# Patient Record
Sex: Female | Born: 1980 | Race: Black or African American | Hispanic: No | Marital: Single | Smoking: Never smoker
Health system: Southern US, Community
[De-identification: ages and names within clinical notes are randomized; demographics above are authoritative.]

## PROBLEM LIST (undated history)

## (undated) DIAGNOSIS — L309 Dermatitis, unspecified: Secondary | ICD-10-CM

## (undated) DIAGNOSIS — D649 Anemia, unspecified: Secondary | ICD-10-CM

## (undated) DIAGNOSIS — R519 Headache, unspecified: Secondary | ICD-10-CM

## (undated) DIAGNOSIS — R51 Headache: Secondary | ICD-10-CM

## (undated) HISTORY — PX: DILATION AND CURETTAGE OF UTERUS: SHX78

---
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
## 2012-11-08 ENCOUNTER — Emergency Department (HOSPITAL_COMMUNITY)
Admission: EM | Admit: 2012-11-08 | Discharge: 2012-11-08 | Disposition: A | Discharge status: Home / Self Care | Payer: Medicaid Other | Attending: Emergency Medicine | Admitting: Emergency Medicine

## 2012-11-08 ENCOUNTER — Encounter (HOSPITAL_COMMUNITY): Payer: Self-pay | Admitting: *Deleted

## 2012-11-08 DIAGNOSIS — T7840XA Allergy, unspecified, initial encounter: Secondary | ICD-10-CM

## 2012-11-08 DIAGNOSIS — R21 Rash and other nonspecific skin eruption: Secondary | ICD-10-CM | POA: Insufficient documentation

## 2012-11-08 MED ORDER — PREDNISONE 50 MG PO TABS
60.0000 mg | ORAL_TABLET | Freq: Once | ORAL | Status: AC
  Administered 2012-11-08: 60 mg via ORAL
  Filled 2012-11-08: qty 1

## 2012-11-08 MED ORDER — PREDNISONE 10 MG PO TABS: 20.0000 mg | ORAL_TABLET | Freq: Every day | ORAL | Status: DC

## 2012-11-08 MED ORDER — FAMOTIDINE 20 MG PO TABS
20.0000 mg | ORAL_TABLET | Freq: Once | ORAL | Status: AC
  Administered 2012-11-08: 20 mg via ORAL
  Filled 2012-11-08: qty 1

## 2012-11-08 MED ORDER — DIPHENHYDRAMINE HCL 25 MG PO CAPS
25.0000 mg | ORAL_CAPSULE | Freq: Once | ORAL | Status: AC
  Administered 2012-11-08: 25 mg via ORAL
  Filled 2012-11-08: qty 1

## 2012-11-08 NOTE — ED Notes (Signed)
Pt alert & oriented x4, stable gait. Patient given discharge instructions, paperwork & prescription(s). Patient  instructed to stop at the registration desk to finish any additional paperwork. Patient verbalized understanding. Pt left department w/ no further questions. 

## 2012-11-08 NOTE — ED Notes (Addendum)
Pt states she noticed a rash around 2000 tonight. Pt denies any nwe, meds, clothes or detergents. Pt states she did have a hotdog from [employer] w/ a new bread today.

## 2012-11-08 NOTE — ED Provider Notes (Signed)
History    CSN: [id number] Arrival date & time 11/08/12  0007  First MD Initiated Contact with Patient 11/08/12 0031     Chief Complaint  Patient presents with  . Allergic Reaction  . Rash   (Consider location/radiation/quality/duration/timing/severity/associated sxs/prior Treatment) HPI HPI Comments: Katie Mills is a 31 y.o. female who presents to the Emergency Department complaining of rash and itching that began this evening around 2000. She took zyrtec without relief. Denies fever, chills, nausea, vomiting, recent exposures, new foods or clothing, difficulty with speaking or swallowing.  History reviewed. No pertinent past medical history. Past Surgical History  Procedure Laterality Date  . Dilation and curettage of uterus     No family history on file. History  Substance Use Topics  . Smoking status: Never Smoker   . Smokeless tobacco: Not on file  . Alcohol Use: No   OB History   Grav Para Term Preterm Abortions TAB SAB Ect Mult Living                 Review of Systems  Constitutional: Negative for fever.       10 Systems reviewed and are negative for acute change except as noted in the HPI.  HENT: Negative for congestion.   Eyes: Negative for discharge and redness.  Respiratory: Negative for cough and shortness of breath.   Cardiovascular: Negative for chest pain.  Gastrointestinal: Negative for vomiting and abdominal pain.  Musculoskeletal: Negative for back pain.  Skin: Positive for rash.  Neurological: Negative for syncope, numbness and headaches.  Psychiatric/Behavioral:       No behavior change.    Allergies  Review of patient's allergies indicates no known allergies.  Home Medications   Current Outpatient Rx  Name  Route  Sig  Dispense  Refill  . predniSONE (DELTASONE) 10 MG tablet   Oral   Take 2 tablets (20 mg total) by mouth daily.   10 tablet   0    BP 137/91  Pulse 66  Temp(Src) 98.2 F (36.8 C) (Oral)  Resp 17  Ht 5\' 7"  (1.702  m)  Wt 207 lb (93.895 kg)  BMI 32.41 kg/m2  SpO2 100%  LMP 10/18/2012 Physical Exam  Nursing note and vitals reviewed. Constitutional: She appears well-developed and well-nourished.  Awake, alert, nontoxic appearance.  HENT:  Head: Normocephalic and atraumatic.  Eyes: EOM are normal. Pupils are equal, round, and reactive to light.  Neck: Normal range of motion. Neck supple.  Cardiovascular: Normal rate and intact distal pulses.   Pulmonary/Chest: Effort normal and breath sounds normal. She exhibits no tenderness.  Abdominal: Soft. Bowel sounds are normal. There is no tenderness. There is no rebound.  Musculoskeletal: She exhibits no tenderness.  Baseline ROM, no obvious new focal weakness.  Neurological:  Mental status and motor strength appears baseline for patient and situation.  Skin: No rash noted.  Fine rash and whelps to inner thighs, around her waist, on her chest.   Psychiatric: She has a normal mood and affect.    ED Course  Procedures (including critical care time)  Medications  diphenhydrAMINE (BENADRYL) capsule 25 mg (25 mg Oral Given 11/08/12 0100)  predniSONE (DELTASONE) tablet 60 mg (60 mg Oral Given 11/08/12 0100)  famotidine (PEPCID) tablet 20 mg (20 mg Oral Given 11/08/12 0100)    1. Allergic reaction, initial encounter     MDM  Patient presents with non specific allergic reaction. Given benadryl, pepcid ac, prednisone. Rash faded, itching resolved. Pt stable in ED  with no significant deterioration in condition.The patient appears reasonably screened and/or stabilized for discharge and I doubt any other medical condition or other [facility] requiring further screening, evaluation, or treatment in the ED at this time prior to discharge.  MDM Reviewed: nursing note and vitals     Nicoletta Dress. Colon Branch, MD 11/08/12 Earle Gell

## 2012-11-14 ENCOUNTER — Emergency Department (HOSPITAL_COMMUNITY): Payer: Medicaid Other

## 2012-11-14 ENCOUNTER — Emergency Department (HOSPITAL_COMMUNITY)
Admission: EM | Admit: 2012-11-14 | Discharge: 2012-11-14 | Disposition: A | Discharge status: Home / Self Care | Payer: Medicaid Other | Attending: Emergency Medicine | Admitting: Emergency Medicine

## 2012-11-14 ENCOUNTER — Encounter (HOSPITAL_COMMUNITY): Payer: Self-pay | Admitting: Emergency Medicine

## 2012-11-14 DIAGNOSIS — Z79899 Other long term (current) drug therapy: Secondary | ICD-10-CM | POA: Insufficient documentation

## 2012-11-14 DIAGNOSIS — Z91040 Latex allergy status: Secondary | ICD-10-CM | POA: Insufficient documentation

## 2012-11-14 DIAGNOSIS — Z3202 Encounter for pregnancy test, result negative: Secondary | ICD-10-CM | POA: Insufficient documentation

## 2012-11-14 DIAGNOSIS — R51 Headache: Secondary | ICD-10-CM | POA: Insufficient documentation

## 2012-11-14 DIAGNOSIS — Z872 Personal history of diseases of the skin and subcutaneous tissue: Secondary | ICD-10-CM | POA: Insufficient documentation

## 2012-11-14 HISTORY — DX: Dermatitis, unspecified: L30.9

## 2012-11-14 LAB — CBC WITH DIFFERENTIAL/PLATELET
Basophils Absolute: 0 10*3/uL (ref 0.0–0.1)
Basophils Relative: 0 % (ref 0–1)
Eosinophils Absolute: 0.1 10*3/uL (ref 0.0–0.7)
Eosinophils Relative: 2 % (ref 0–5)
MCH: 29.9 pg (ref 26.0–34.0)
MCHC: 33.2 g/dL (ref 30.0–36.0)
MCV: 90.1 fL (ref 78.0–100.0)
Neutrophils Relative %: 56 % (ref 43–77)
Platelets: 227 10*3/uL (ref 150–400)
RBC: 3.54 MIL/uL — ABNORMAL LOW (ref 3.87–5.11)
RDW: 12.1 % (ref 11.5–15.5)

## 2012-11-14 LAB — COMPREHENSIVE METABOLIC PANEL
ALT: 11 U/L (ref 0–35)
Albumin: 3.4 g/dL — ABNORMAL LOW (ref 3.5–5.2)
Alkaline Phosphatase: 58 U/L (ref 39–117)
Calcium: 8.9 mg/dL (ref 8.4–10.5)
GFR calc Af Amer: >90 mL/min (ref >90)
Potassium: 3.7 mEq/L (ref 3.5–5.1)
Sodium: 139 mEq/L (ref 135–145)
Total Protein: 7.3 g/dL (ref 6.0–8.3)

## 2012-11-14 LAB — URINALYSIS, ROUTINE W REFLEX MICROSCOPIC
Glucose, UA: NEGATIVE mg/dL
Protein, ur: NEGATIVE mg/dL
pH: 6 (ref 5.0–8.0)

## 2012-11-14 LAB — URINE MICROSCOPIC-ADD ON

## 2012-11-14 LAB — PREGNANCY, URINE: Preg Test, Ur: NEGATIVE

## 2012-11-14 MED ORDER — SODIUM CHLORIDE 0.9 % IV BOLUS (SEPSIS)
1000.0000 mL | Freq: Once | INTRAVENOUS | Status: AC
  Administered 2012-11-14: 1000 mL via INTRAVENOUS

## 2012-11-14 MED ORDER — OXYCODONE-ACETAMINOPHEN 5-325 MG PO TABS
ORAL_TABLET | ORAL | Status: AC
  Administered 2012-11-14: 1 via ORAL
  Filled 2012-11-14: qty 1

## 2012-11-14 MED ORDER — HYDROCODONE-ACETAMINOPHEN 5-325 MG PO TABS: 1.0000 | ORAL_TABLET | Freq: Four times a day (QID) | ORAL | Status: DC | PRN

## 2012-11-14 MED ORDER — OXYCODONE-ACETAMINOPHEN 5-325 MG PO TABS: 1.0000 | ORAL_TABLET | Freq: Once | ORAL | Status: AC

## 2012-11-14 MED ORDER — ONDANSETRON HCL 4 MG/2ML IJ SOLN
4.0000 mg | Freq: Once | INTRAMUSCULAR | Status: AC
  Administered 2012-11-14: 4 mg via INTRAVENOUS
  Filled 2012-11-14: qty 2
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
## 2012-11-14 NOTE — ED Notes (Signed)
intermittent dizziness

## 2012-11-14 NOTE — ED Provider Notes (Signed)
History    CSN: [id number] Arrival date & time 11/14/12  1140  First MD Initiated Contact with Patient 11/14/12 1306     Chief Complaint  Patient presents with  . Headache   (Consider location/radiation/quality/duration/timing/severity/associated sxs/prior Treatment) Patient is a 32 y.o. female presenting with headaches. The history is provided by the patient (pt complains of a headache). No language interpreter was used.  Headache Pain location:  Frontal Quality:  Dull Radiates to:  Does not radiate Severity currently:  5/10 Severity at highest:  6/10 Onset quality:  Sudden Timing:  Constant Progression:  Waxing and waning Chronicity:  New Associated symptoms: no abdominal pain, no back pain, no congestion, no cough, no diarrhea, no fatigue, no seizures and no sinus pressure    Past Medical History  Diagnosis Date  . Eczema    Past Surgical History  Procedure Laterality Date  . Dilation and curettage of uterus     History reviewed. No pertinent family history. History  Substance Use Topics  . Smoking status: Never Smoker   . Smokeless tobacco: Not on file  . Alcohol Use: No   OB History   Grav Para Term Preterm Abortions TAB SAB Ect Mult Living                 Review of Systems  Constitutional: Negative for appetite change and fatigue.  HENT: Negative for congestion, sinus pressure and ear discharge.   Eyes: Negative for discharge.  Respiratory: Negative for cough.   Cardiovascular: Negative for chest pain.  Gastrointestinal: Negative for abdominal pain and diarrhea.  Genitourinary: Negative for frequency and hematuria.  Musculoskeletal: Negative for back pain.  Skin: Negative for rash.  Neurological: Positive for headaches. Negative for seizures.  Psychiatric/Behavioral: Negative for hallucinations.    Allergies  Latex and Tomato  Home Medications   Current Outpatient Rx  Name  Route  Sig  Dispense  Refill  . acetaminophen (TYLENOL) 500 MG tablet   Oral   Take 1,000 mg by mouth every 6 (six) hours as needed for pain.         Marland Kitchen ibuprofen (ADVIL,MOTRIN) 200 MG tablet   Oral   Take 800 mg by mouth every 6 (six) hours as needed for pain.         . predniSONE (DELTASONE) 10 MG tablet   Oral   Take 2 tablets (20 mg total) by mouth daily.   10 tablet   0   . HYDROcodone-acetaminophen (NORCO/VICODIN) 5-325 MG per tablet   Oral   Take 1 tablet by mouth every 6 (six) hours as needed for pain.   10 tablet   0    BP 134/90  Pulse 78  Temp(Src) 98.5 F (36.9 C) (Oral)  Resp 20  SpO2 100%  LMP 10/18/2012 Physical Exam  Constitutional: She is oriented to person, place, and time. She appears well-developed.  HENT:  Head: Normocephalic.  Eyes: Conjunctivae and EOM are normal. No scleral icterus.  Neck: Neck supple. No thyromegaly present.  Cardiovascular: Normal rate and regular rhythm.  Exam reveals no gallop and no friction rub.   No murmur heard. Pulmonary/Chest: No stridor. She has no wheezes. She has no rales. She exhibits no tenderness.  Abdominal: She exhibits no distension. There is no tenderness. There is no rebound.  Musculoskeletal: Normal range of motion. She exhibits no edema.  Lymphadenopathy:    She has no cervical adenopathy.  Neurological: She is oriented to person, place, and time. Coordination normal.  Skin: No  rash noted. No erythema.  Psychiatric: She has a normal mood and affect. Her behavior is normal.    ED Course  Procedures (including critical care time) Labs Reviewed  URINALYSIS, ROUTINE W REFLEX MICROSCOPIC - Abnormal; Notable for the following:    Specific Gravity, Urine >1.030 (*)    Hgb urine dipstick SMALL (*)    All other components within normal limits  URINE MICROSCOPIC-ADD ON - Abnormal; Notable for the following:    Squamous Epithelial / LPF FEW (*)    All other components within normal limits  CBC WITH DIFFERENTIAL - Abnormal; Notable for the following:    RBC 3.54 (*)     Hemoglobin 10.6 (*)    HCT 31.9 (*)    All other components within normal limits  COMPREHENSIVE METABOLIC PANEL - Abnormal; Notable for the following:    Albumin 3.4 (*)    Total Bilirubin 0.2 (*)    All other components within normal limits  PREGNANCY, URINE   Ct Head Wo Contrast  11/14/2012   *RADIOLOGY REPORT*  Clinical Data: Headaches and light sensitivity.  CT HEAD WITHOUT CONTRAST  Technique:  Contiguous axial images were obtained from the base of the skull through the vertex without contrast.  Comparison: None.  Findings: There is no acute intracranial hemorrhage, infarction, or mass lesion.  Brain parenchyma is normal.  Osseous structures are normal.  IMPRESSION: Normal exam.   Original Report Authenticated By: Francene Boyers, M.D.   1. Headache     MDM    Benny Lennert, MD 11/14/12 [phone]
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
## 2012-12-23 ENCOUNTER — Emergency Department (HOSPITAL_COMMUNITY): Payer: Medicaid Other

## 2012-12-23 ENCOUNTER — Encounter (HOSPITAL_COMMUNITY): Payer: Self-pay | Admitting: Emergency Medicine

## 2012-12-23 ENCOUNTER — Emergency Department (HOSPITAL_COMMUNITY)
Admission: EM | Admit: 2012-12-23 | Discharge: 2012-12-23 | Disposition: A | Discharge status: Home / Self Care | Payer: Medicaid Other | Attending: Emergency Medicine | Admitting: Emergency Medicine

## 2012-12-23 DIAGNOSIS — IMO0002 Reserved for concepts with insufficient information to code with codable children: Secondary | ICD-10-CM | POA: Insufficient documentation

## 2012-12-23 DIAGNOSIS — Z872 Personal history of diseases of the skin and subcutaneous tissue: Secondary | ICD-10-CM | POA: Insufficient documentation

## 2012-12-23 DIAGNOSIS — M549 Dorsalgia, unspecified: Secondary | ICD-10-CM

## 2012-12-23 DIAGNOSIS — Y9289 Other specified places as the place of occurrence of the external cause: Secondary | ICD-10-CM | POA: Insufficient documentation

## 2012-12-23 DIAGNOSIS — S161XXA Strain of muscle, fascia and tendon at neck level, initial encounter: Secondary | ICD-10-CM

## 2012-12-23 DIAGNOSIS — S139XXA Sprain of joints and ligaments of unspecified parts of neck, initial encounter: Secondary | ICD-10-CM | POA: Insufficient documentation

## 2012-12-23 DIAGNOSIS — Z3202 Encounter for pregnancy test, result negative: Secondary | ICD-10-CM | POA: Insufficient documentation

## 2012-12-23 DIAGNOSIS — Y9389 Activity, other specified: Secondary | ICD-10-CM | POA: Insufficient documentation

## 2012-12-23 MED ORDER — METHOCARBAMOL 500 MG PO TABS: ORAL_TABLET | ORAL | Status: DC

## 2012-12-23 MED ORDER — NAPROXEN 500 MG PO TABS: 500.0000 mg | ORAL_TABLET | Freq: Two times a day (BID) | ORAL | Status: DC
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
## 2012-12-24 NOTE — ED Provider Notes (Signed)
Medical screening examination/treatment/procedure(s) were performed by non-physician practitioner and as supervising physician I was immediately available for consultation/collaboration.    Miya Luviano D Ramon Zanders, MD 12/24/12 0933 

## 2014-01-25 ENCOUNTER — Emergency Department (HOSPITAL_COMMUNITY)
Admission: EM | Admit: 2014-01-25 | Discharge: 2014-01-25 | Disposition: A | Discharge status: Home / Self Care | Payer: Medicaid Other | Attending: Emergency Medicine | Admitting: Emergency Medicine

## 2014-01-25 ENCOUNTER — Encounter (HOSPITAL_COMMUNITY): Payer: Self-pay | Admitting: Emergency Medicine

## 2014-01-25 ENCOUNTER — Emergency Department (HOSPITAL_COMMUNITY): Payer: Medicaid Other

## 2014-01-25 DIAGNOSIS — R51 Headache: Secondary | ICD-10-CM | POA: Insufficient documentation

## 2014-01-25 DIAGNOSIS — Z91040 Latex allergy status: Secondary | ICD-10-CM | POA: Insufficient documentation

## 2014-01-25 DIAGNOSIS — R079 Chest pain, unspecified: Secondary | ICD-10-CM | POA: Insufficient documentation

## 2014-01-25 DIAGNOSIS — M7918 Myalgia, other site: Secondary | ICD-10-CM

## 2014-01-25 DIAGNOSIS — Z79899 Other long term (current) drug therapy: Secondary | ICD-10-CM | POA: Diagnosis not present

## 2014-01-25 DIAGNOSIS — IMO0002 Reserved for concepts with insufficient information to code with codable children: Secondary | ICD-10-CM | POA: Diagnosis not present

## 2014-01-25 DIAGNOSIS — Z872 Personal history of diseases of the skin and subcutaneous tissue: Secondary | ICD-10-CM | POA: Diagnosis not present

## 2014-01-25 DIAGNOSIS — IMO0001 Reserved for inherently not codable concepts without codable children: Secondary | ICD-10-CM | POA: Diagnosis not present

## 2014-01-25 HISTORY — DX: Headache, unspecified: R51.9

## 2014-01-25 HISTORY — DX: Headache: R51

## 2014-01-25 LAB — I-STAT CHEM 8, ED
BUN: 12 mg/dL (ref 6–23)
CREATININE: 0.8 mg/dL (ref 0.50–1.10)
Calcium, Ion: 1.16 mmol/L (ref 1.12–1.23)
Chloride: 103 mEq/L (ref 96–112)
Glucose, Bld: 106 mg/dL — ABNORMAL HIGH (ref 70–99)
HCT: 34 % — ABNORMAL LOW (ref 36.0–46.0)
HEMOGLOBIN: 11.6 g/dL — AB (ref 12.0–15.0)
Potassium: 3.3 mEq/L — ABNORMAL LOW (ref 3.7–5.3)
SODIUM: 139 meq/L (ref 137–147)
TCO2: 24 mmol/L (ref 0–100)

## 2014-01-25 LAB — I-STAT TROPONIN, ED: Troponin i, poc: 0 ng/mL (ref 0.00–0.08)

## 2014-01-25 MED ORDER — GI COCKTAIL ~~LOC~~
30.0000 mL | Freq: Once | ORAL | Status: AC
  Administered 2014-01-25: 30 mL via ORAL
  Filled 2014-01-25: qty 30

## 2014-01-25 MED ORDER — ACETAMINOPHEN 500 MG PO TABS
1000.0000 mg | ORAL_TABLET | Freq: Once | ORAL | Status: AC
  Administered 2014-01-25: 1000 mg via ORAL
  Filled 2014-01-25: qty 2

## 2014-01-25 MED ORDER — IBUPROFEN 400 MG PO TABS
400.0000 mg | ORAL_TABLET | Freq: Once | ORAL | Status: AC
  Administered 2014-01-25: 400 mg via ORAL
  Filled 2014-01-25: qty 1

## 2014-01-25 MED ORDER — METHOCARBAMOL 500 MG PO TABS: 1000.0000 mg | ORAL_TABLET | Freq: Four times a day (QID) | ORAL | Status: DC | PRN

## 2014-01-25 MED ORDER — HYDROCODONE-ACETAMINOPHEN 5-325 MG PO TABS: ORAL_TABLET | ORAL | Status: DC

## 2014-01-25 NOTE — ED Provider Notes (Signed)
CSN: [id number]     Arrival date & time 01/25/14  4098 History   First MD Initiated Contact with Patient 01/25/14 0256     Chief Complaint  Patient presents with  . Chest Pain      HPI Pt was seen at 0300. Per pt, c/o gradual onset and persistence of constant upper back "pain" and chest "pain" that began yesterday afternoon at 1400. Pt states she was at work when the discomfort began. 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 Pain worsens with palpation of the area and body position changes. States the pain "takes her breath." Denies palpitations, no SOB/cough, no abd pain, no N/V/D, no rash, no fevers, no injury.    Past Medical History  Diagnosis Date  . Eczema   . Headache    Past Surgical History  Procedure Laterality Date  . Dilation and curettage of uterus     Family History  Problem Relation Age of Onset  . Cancer Other    History  Substance Use Topics  . Smoking status: Never Smoker   . Smokeless tobacco: Never Used  . Alcohol Use: No   OB History   Grav Para Term Preterm Abortions TAB SAB Ect Mult Living   Review of Systems ROS: Statement: All systems negative except as marked or noted in the HPI; Constitutional: Negative for fever and chills. ; ; Eyes: Negative for eye pain, redness and discharge. ; ; ENMT: Negative for ear pain, hoarseness, nasal congestion, sinus pressure and sore throat. ; ; Cardiovascular: +CP. Negative for palpitations, diaphoresis, and peripheral edema. ; ; Respiratory: Negative for cough, wheezing and stridor. ; ; Gastrointestinal: Negative for nausea, vomiting, diarrhea, abdominal pain, blood in stool, hematemesis, jaundice and rectal bleeding. . ; ; Genitourinary: Negative for dysuria, flank pain and hematuria. ; ; Musculoskeletal: +back pain. Negative for neck pain. Negative for swelling and trauma.; ; Skin: Negative for pruritus, rash, abrasions, blisters, bruising and skin lesion.; ; Neuro: Negative for headache,  lightheadedness and neck stiffness. Negative for weakness, altered level of consciousness , altered mental status, extremity weakness, paresthesias, involuntary movement, seizure and syncope.      Allergies  Latex and Tomato  Home Medications   Prior to Admission medications   Medication Sig Start Date End Date Taking? Authorizing Provider  HYDROcodone-acetaminophen (NORCO/VICODIN) 5-325 MG per tablet Take 1 tablet by mouth every 6 (six) hours as needed for pain. 11/14/12  Yes Benny Lennert, MD  naproxen (NAPROSYN) 500 MG tablet Take 1 tablet (500 mg total) by mouth 2 (two) times daily with a meal. 12/23/12  Yes Tammy L. Triplett, PA-C  acetaminophen (TYLENOL) 500 MG tablet Take 1,000 mg by mouth every 6 (six) hours as needed for pain.    Historical Provider, MD  HYDROcodone-acetaminophen (NORCO/VICODIN) 5-325 MG per tablet 1 or 2 tabs PO q6 hours prn pain 01/25/14   Samuel Jester, DO  ibuprofen (ADVIL,MOTRIN) 200 MG tablet Take 800 mg by mouth every 6 (six) hours as needed for pain.    Historical Provider, MD  methocarbamol (ROBAXIN) 500 MG tablet Two tabs po TID prn muscle spasms 12/23/12   Tammy L. Triplett, PA-C  methocarbamol (ROBAXIN) 500 MG tablet Take 2 tablets (1,000 mg total) by mouth 4 (four) times daily as needed for muscle spasms (muscle spasm/pain). 01/25/14   Samuel Jester, DO  predniSONE (DELTASONE) 10 MG tablet Take 2 tablets (20 mg total) by mouth daily. 11/08/12  Annamarie Dawley, MD   BP 136/88  Pulse 74  Temp(Src) 98.1 F (36.7 C) (Oral)  Resp 18  Ht 5' 7.5" (1.715 m)  Wt 207 lb (93.895 kg)  BMI 31.92 kg/m2  SpO2 100%  LMP 01/08/2014 Physical Exam 0305: Physical examination:  Nursing notes reviewed; Vital signs and O2 SAT reviewed;  Constitutional: Well developed, Well nourished, Well hydrated, In no acute distress; Head:  Normocephalic, atraumatic; Eyes: EOMI, PERRL, No scleral icterus; ENMT: Mouth and pharynx normal, Mucous membranes moist; Neck: Supple, Full  range of motion, No lymphadenopathy; Cardiovascular: Regular rate and rhythm, No murmur, rub, or gallop; Respiratory: Breath sounds clear & equal bilaterally, No rales, rhonchi, wheezes.  Speaking full sentences with ease, Normal respiratory effort/excursion; Chest: +bilat anterior chest wall tender to palp. No rash, no deformity, no soft tissue crepitus. Movement normal; Abdomen: Soft, Nontender, Nondistended, Normal bowel sounds; Genitourinary: No CVA tenderness; Spine:  No midline CS, TS, LS tenderness. +TTP bilat thoracic paraspinal muscles. No rash, no ecchymosis.;; Extremities: Pulses normal, No tenderness, No edema, No calf edema or asymmetry.; Neuro: AA&Ox3, Major CN grossly intact.  Speech clear. No gross focal motor or sensory deficits in extremities.; Skin: Color normal, Warm, Dry.   ED Course  Procedures     EKG Interpretation   Date/Time:  Wednesday January 25 2014 02:50:36 EDT Ventricular Rate:  71 PR Interval:  194 QRS Duration: 91 QT Interval:  381 QTC Calculation: 414 R Axis:   56 Text Interpretation:  Sinus rhythm Consider left atrial enlargement  Baseline wander No old tracing to compare Confirmed by [facility]  MD,  Nicholos Johns [phone]) on 01/25/2014 3:51:26 AM      MDM  MDM Reviewed: previous chart, nursing note and vitals Reviewed previous: labs Interpretation: labs, ECG and x-ray    Results for orders placed during the hospital encounter of 01/25/14  I-STAT CHEM 8, ED      Result Value Ref Range   Sodium 139  137 - 147 mEq/L   Potassium 3.3 (*) 3.7 - 5.3 mEq/L   Chloride 103  96 - 112 mEq/L   BUN 12  6 - 23 mg/dL   Creatinine, Ser 5.78  0.50 - 1.10 mg/dL   Glucose, Bld 469 (*) 70 - 99 mg/dL   Calcium, Ion 6.29  5.28 - 1.23 mmol/L   TCO2 24  0 - 100 mmol/L   Hemoglobin 11.6 (*) 12.0 - 15.0 g/dL   HCT 41.3 (*) 24.4 - 01.0 %  I-STAT TROPOININ, ED      Result Value Ref Range   Troponin i, poc 0.00  0.00 - 0.08 ng/mL   Comment 3            Dg Chest 2  View 01/25/2014   CLINICAL DATA:  Mid back pain.  Chest pain and shortness of breath.  EXAM: CHEST  2 VIEW  COMPARISON:  None.  FINDINGS: The lungs are well-aerated and clear. There is no evidence of focal opacification, pleural effusion or pneumothorax.  The heart is normal in size; the mediastinal contour is within normal limits. No acute osseous abnormalities are seen.  IMPRESSION: No acute cardiopulmonary process seen.   Electronically Signed   By: Roanna Raider M.D.   On: 01/25/2014 04:19    0430:  Pt feels better after meds and wants to go home now. Doubt PE as cause for symptoms with low risk [place].  Doubt ACS as cause for symptoms with normal troponin and unchanged EKG from previous after 12+  hours of constant symptoms. CXR with normal mediastinum. Will continue to tx symptomatically at this time. Dx and testing d/w pt.  Questions answered.  Verb understanding, agreeable to d/c home with outpt f/u.     Samuel Jester, DO 01/28/14 [phone]

## 2014-01-25 NOTE — ED Notes (Signed)
Pt c/o mid back pain that started 6 hours ago and now is having chest pain with sob.

## 2014-01-25 NOTE — Discharge Instructions (Signed)
°Emergency Department Resource Guide °1) Find a Doctor and Pay Out of Pocket °Although you won't have to find out who is covered by your insurance plan, it is a good idea to ask around and get recommendations. You will then need to call the office and see if the doctor you have chosen will accept you as a new patient and what types of options they offer for patients who are self-pay. Some doctors offer discounts or will set up payment plans for their patients who do not have insurance, but you will need to ask so you aren't surprised when you get to your appointment. ° °2) Contact Your Local Health Department °Not all health departments have doctors that can see patients for sick visits, but many do, so it is worth a call to see if yours does. If you don't know where your local health department is, you can check in your phone book. The CDC also has a tool to help you locate your state's health department, and many state websites also have listings of all of their local health departments. ° °3) Find a Walk-in Clinic °If your illness is not likely to be very severe or complicated, you may want to try a walk in clinic. These are popping up all over the country in pharmacies, drugstores, and shopping centers. They're usually staffed by nurse practitioners or physician assistants that have been trained to treat common illnesses and complaints. They're usually fairly quick and inexpensive. However, if you have serious medical issues or chronic medical problems, these are probably not your best option. ° °No Primary Care Doctor: °- Call [facility] at  832-8000 - they can help you locate a primary care doctor that  accepts your insurance, provides certain services, etc. °- Physician Referral Service- 1-800-533-3463 ° °Chronic Pain Problems: °Organization         Address  Phone   Notes  °[facility]  (336) 297-2271 Patients need to be referred by their primary care doctor.  ° °Medication  Assistance: °Organization         Address  Phone   Notes  °[place] [facility] [address] °[place], [place] [address] (336) 641-8030 --Must be a resident of [place] °-- Must have NO insurance coverage whatsoever (no Medicaid/ Medicare, etc.) °-- The pt. MUST have a primary care doctor that directs their care regularly and follows them in the community °  °[facility]  (866) 331-1348   °[employer]  (888) 892-1162   ° °Agencies that provide inexpensive medical care: °Organization         Address  Phone   Notes  °[facility]  (336) 832-8035   °[facility]    (336) 832-7272   °[facility] [address] °[place], [place] [address] (336) 832-4777   °[facility] [address], °[place] (336) 271-4999   °[facility]    (336) 373-0678   °[facility]    (336) 272-1050   °[facility] ° [address], [place] Phone:  (336) 832-4444, Fax:  (336) 832-4440 Hours of Operation:  9 am - 6 pm, M-F.  Also accepts Medicaid/Medicare and self-pay.  °[facility] ° [address], [place] Phone: (336) 832-3150, Fax: (336) [phone]. Hours of Operation:  8:30 am - 5:30 pm, M-F.  Also accepts Medicaid and self-pay.  °[facility] [place] [address], [place] Phone: (336) 878-6027   °[facility] [address], [place], [place] (336)723-1848, Ext. 123 Mondays & Thursdays: 7-9 AM.  First 15 patients are seen on a first come, first serve basis. °  ° °Medicaid-accepting [place] Providers: ° °Organization         Address  Phone   Notes  °[facility] [address], [place] (336) 641-2100 Also accepts self-pay patients.  °[facility] [address], [place] ° (336) 856-9996   °[facility] [address], [place]  (336) 288-8857   °[facility] [address] [place] [address], [place] (336) 299-7000   °Veita Bland [address], [place]  ° (336) 373-1557 Only accepts [place] Access Medicaid patients after they have their name applied to their card.  ° °Self-Pay (no insurance) in [place]: ° °Organization         Address  Phone   Notes  °Sickle Cell Patients, [facility] [address], [place] (336) 832-1970   °[facility] [address], [place] (336) 832-4400   °[facility] ° [address],  (336) 992-4800   °[facility]/Dr. Osei-Bonsu ° [address] [place] [address], [place] or [address], [place] (336) [phone] Phone number for both [place] and [place] locations is the same.  °[facility] [address], [place] (336) 299-0000   °[facility] [address] [place] [address], [place] or [address] (336) 852-7530 °(336) 878-2260   °[facility] [address], [place] (336) 350-1642, phone; (336) 294-5005, fax Sees patients 1st and 3rd Saturday of every month.  Must not qualify for public or private insurance (i.e. Medicaid, Medicare, [place] Health Choice, Veterans' Benefits) • Household income should be no more than 200% of the poverty level •The clinic cannot treat you if you are pregnant or think you are pregnant • Sexually transmitted diseases are not treated at the clinic.  ° ° °Dental Care: °Organization         Address  Phone  Notes  °[place] [facility] [address], [place] (336) 641-6152 Accepts children up to age 21 who are enrolled in Medicaid or [place] Health Choice; pregnant women with a Medicaid card; and children who have applied for Medicaid or [place] Health Choice, but were declined, whose parents can pay a reduced fee at time of service.  °[place]  [facility] [place]  [address], [place] (336) 641-7733 Accepts children up to age 21 who are enrolled in Medicaid or [place] Health Choice; pregnant women with a Medicaid card; and children who have applied for Medicaid or [place] Health Choice, but were declined, whose parents can pay a reduced fee at time of service.  °[facility] ° [address], [place] (336) 641-4533 Patients are seen by appointment only. Walk-ins are not accepted. [facility] will see patients 18 years of age and older. °Monday - Tuesday (8am-5pm) °Most Wednesdays (8:30-5pm) °$30 per visit, cash only  °[facility] ° [address], [place] (336) 641-4533 Patients are seen by appointment only. Walk-ins are not accepted. [facility] will see patients 18 years of age and older. °One   Wednesday Evening (Monthly: Volunteer Based).  $30 per visit, cash only  °[facility]  (919) 537-3737 for adults; Children under age 4, call Graduate Pediatric Dentistry at ([phone]) [phone]. Children aged 4-14, please call (919) [phone] to request a pediatric application. ° Dental services are provided in all areas of dental care including fillings, crowns and bridges, complete and partial dentures, implants, gum treatment, root canals, and extractions. Preventive care is also provided. Treatment is provided to both adults and children. °Patients are selected via a lottery and there is often a waiting list. °  °[facility] [address], °[place] ° (336) 763-8833 [email or web address] °  °[facility] [address], [place], [place] (336)723-1848, Ext. 123 Second and Fourth Thursday of each month, opens at 6:30 AM; Clinic ends at 9 AM.  Patients are seen on a first-come first-served basis, and a limited number are seen during each clinic.  ° °[facility] ° [address], [place], [place] (336) 723-7904    Eligibility Requirements °You must have lived in [place], [place], or [place] counties for at least the last three months. °  You cannot be eligible for state or federal sponsored healthcare insurance, including Veterans Administration, Medicaid, or Medicare. °  You generally cannot be eligible for healthcare insurance through your employer.  °  How to apply: °Eligibility screenings are held every Tuesday and Wednesday afternoon from 1:00 pm until 4:00 pm. You do not need an appointment for the interview!  °[facility] [address], [place], [place] 336-631-2330   °[facility]  336-342-8273   °[facility]  336-703-3100   °[facility]  336-570-6415   ° °Behavioral Health Resources in the Community: °Intensive Outpatient Programs °Organization         Address  Phone  Notes  °[facility] [address], [place], [place] 336-878-6098   °[facility] [address], [place], [place] 336-832-9800   °[facility] [address], [place], [place] ° 336-882-2125   °[facility] [address],  °[place], [place] 1-800-853-5163 or 336-641-4981   °Substance Abuse Resources °Organization         Address  Phone  Notes  °[facility]  336-882-2125   °[facility]  336-784-9470   °[facility]  336-285-9073   °[facility]  336-845-3988   °Residential & Outpatient Substance Abuse Program  1-800-659-3381   °Psychological Services °Organization         Address  Phone  Notes  °[facility]  336- 832-9600   °[facility]  336- 378-7881   °[facility] [address], [place] 1-800-853-5163 or 336-641-4981   ° °Mobile Crisis Teams °Organization         Address  Phone  Notes  °[facility], Mobile Crisis Care Unit  1-877-626-1772   °Assertive °[facility] ° [address]  [place], [place] 336-834-9664   °Sharon DeEsch [address] °[place] 336-554-5454   ° °Self-Help/Support Groups °Organization         Address  Phone             Notes  °[facility] - variety of support groups  336- 373-1402 Call for more information  °Narcotics Anonymous (NA), [facility] [address], °[place]  2 meetings at this location  ° °  Residential Treatment Programs °Organization         Address  Phone  Notes  °[facility] [address],    °[place]  1-866-801-8205   °[facility] ° [address], [place], [place] 704-293-8524   °[facility] [address], [place] 336-845-3988 Admissions: 8am-3pm M-F  °[facility] [address]    °[place], [place] 336-841-1104   °[facility] [address], [place], [place] 336-379-7146   °[facility] [address]  °[place], [place] 336-285-9073   °[facility] Intensive Outpatient [address], [place], [place] 336-852-3033   °[facility] [address]  °[place], [place] 1-877-615-2722 or 336-784-9470   °[facility] [address] [place], [place] 336-227-7417 Accepts Medicaid  °[facility] [address]  °[place] 1-800-659-3381 Substance Abuse/Addiction Treatment  ° °[place] Behavioral Health Resources °Organization         Address  Phone  Notes  °[facility]  (888) 581-9988   °Julie Brannon, PhD [address] [place], [place]   (336) 349-5553 or (336) 951-0000   °[facility]   [address] °[place], [place] (336) 349-4454   °[facility] [address], [place], [place] (336) 342-8316 Insurance/Medicaid/sponsorship through [facility]  °[facility] [address]                                    [place], [place] (336) 342-8316 Therapy/tele-psych/case    °[facility] [address]  ° [place], [place] (336) 349-2233    °Dr. Arfeen  (336) 349-4544   °[facility] [place]  [employer] [place] [facility] 1) [address], [place] °2) [address], [place] °3)  [address], [place] (336) 349-3220 °(336) 342-7768 ° °(336) 342-8140   °[place] [facility] (336) 342-1394 or (336) 342-3537 (After Hours)    ° ° °Take the prescriptions as directed.  Apply moist heat or ice to the area(s) of discomfort, for 15 minutes at a time, several times per day for the next few days.  Do not fall asleep on a heating or ice pack.  Call your regular medical doctor today to schedule a follow up appointment in the next 2 days.  Return to the Emergency Department immediately if worsening. ° °

## 2014-03-13 ENCOUNTER — Encounter (HOSPITAL_COMMUNITY): Payer: Self-pay | Admitting: Emergency Medicine

## 2015-10-16 ENCOUNTER — Emergency Department (HOSPITAL_COMMUNITY): Payer: Medicaid Other

## 2015-10-16 ENCOUNTER — Encounter (HOSPITAL_COMMUNITY): Payer: Self-pay | Admitting: Emergency Medicine

## 2015-10-16 DIAGNOSIS — M79671 Pain in right foot: Secondary | ICD-10-CM | POA: Insufficient documentation

## 2015-10-16 DIAGNOSIS — Z5321 Procedure and treatment not carried out due to patient leaving prior to being seen by health care provider: Secondary | ICD-10-CM | POA: Diagnosis not present

## 2015-10-16 NOTE — ED Notes (Signed)
Pt hit R foot on slide. Foot is swollen and painful.

## 2015-10-17 ENCOUNTER — Emergency Department (HOSPITAL_COMMUNITY)
Admission: EM | Admit: 2015-10-17 | Discharge: 2015-10-17 | Disposition: A | Discharge status: Home / Self Care | Payer: Medicaid Other | Attending: Emergency Medicine | Admitting: Emergency Medicine

## 2015-10-17 NOTE — ED Notes (Signed)
Per registration pt left facility. 

## 2017-12-02 ENCOUNTER — Encounter: Payer: Self-pay | Admitting: Emergency Medicine

## 2017-12-02 ENCOUNTER — Emergency Department
Admission: EM | Admit: 2017-12-02 | Discharge: 2017-12-02 | Disposition: A | Discharge status: Home / Self Care | Payer: Medicaid Other | Attending: Emergency Medicine | Admitting: Emergency Medicine

## 2017-12-02 ENCOUNTER — Emergency Department: Payer: Medicaid Other

## 2017-12-02 DIAGNOSIS — Z79899 Other long term (current) drug therapy: Secondary | ICD-10-CM | POA: Diagnosis not present

## 2017-12-02 DIAGNOSIS — Z91040 Latex allergy status: Secondary | ICD-10-CM | POA: Diagnosis not present

## 2017-12-02 DIAGNOSIS — J039 Acute tonsillitis, unspecified: Secondary | ICD-10-CM | POA: Insufficient documentation

## 2017-12-02 DIAGNOSIS — J029 Acute pharyngitis, unspecified: Secondary | ICD-10-CM | POA: Diagnosis present

## 2017-12-02 HISTORY — DX: Anemia, unspecified: D64.9

## 2017-12-02 LAB — BASIC METABOLIC PANEL
ANION GAP: 6 (ref 5–15)
BUN: 13 mg/dL (ref 6–20)
CALCIUM: 8.5 mg/dL — AB (ref 8.9–10.3)
CO2: 26 mmol/L (ref 22–32)
CREATININE: 0.85 mg/dL (ref 0.44–1.00)
Chloride: 106 mmol/L (ref 98–111)
GFR calc non Af Amer: >60 mL/min (ref >60)
GLUCOSE: 102 mg/dL — AB (ref 70–99)
Potassium: 3.5 mmol/L (ref 3.5–5.1)
Sodium: 138 mmol/L (ref 135–145)

## 2017-12-02 LAB — CBC
HCT: 33.9 % — ABNORMAL LOW (ref 35.0–47.0)
Hemoglobin: 11.6 g/dL — ABNORMAL LOW (ref 12.0–16.0)
MCH: 30.4 pg (ref 26.0–34.0)
MCHC: 34.3 g/dL (ref 32.0–36.0)
MCV: 88.8 fL (ref 80.0–100.0)
Platelets: 262 10*3/uL (ref 150–440)
RBC: 3.82 MIL/uL (ref 3.80–5.20)
RDW: 12.9 % (ref 11.5–14.5)
WBC: 9.3 10*3/uL (ref 3.6–11.0)

## 2017-12-02 MED ORDER — CEPHALEXIN 500 MG PO CAPS: 500.0000 mg | ORAL_CAPSULE | Freq: Three times a day (TID) | ORAL | 0 refills | Status: DC

## 2017-12-02 MED ORDER — DEXAMETHASONE SODIUM PHOSPHATE 10 MG/ML IJ SOLN
10.0000 mg | Freq: Once | INTRAMUSCULAR | Status: AC
  Administered 2017-12-02: 10 mg via INTRAVENOUS
  Filled 2017-12-02: qty 1

## 2017-12-02 MED ORDER — GI COCKTAIL ~~LOC~~
30.0000 mL | Freq: Once | ORAL | Status: AC
  Administered 2017-12-02: 30 mL via ORAL
  Filled 2017-12-02: qty 30

## 2017-12-02 MED ORDER — IOHEXOL 300 MG/ML  SOLN
75.0000 mL | Freq: Once | INTRAMUSCULAR | Status: AC | PRN
  Administered 2017-12-02: 75 mL via INTRAVENOUS

## 2017-12-02 NOTE — ED Triage Notes (Signed)
Patient presents to ED via POV from home with c/o sore throat. Patient states "It feels like something is draining and like my throat is closing". Large peritonsillar abscess noted to left tonsil. Patient able to swallow. Even and non labored respirations noted.

## 2017-12-02 NOTE — ED Provider Notes (Signed)
[facility] Emergency Department Provider Note  Time seen: 9:52 AM  I have reviewed the triage vital signs and the nursing notes.   HISTORY  Chief Complaint Sore Throat    HPI Katie Mills is a 37 y.o. female with a past medical history of anemia presents to the emergency department for left throat pain.  According to the patient yesterday she developed a sore throat mostly on the left side.  Today she states pain with attempting to swallow and worsening pain in the left side of her throat.  Denies any nausea or vomiting, denies any trouble breathing does state pain with attempted swallowing.  Able to handle her secretions without issue.  No known fever.  No known sick contacts.  States moderate throat pain currently.   Past Medical History:  Diagnosis Date  . Anemia   . Eczema   . Headache     There are no active problems to display for this patient.   Past Surgical History:  Procedure Laterality Date  . DILATION AND CURETTAGE OF UTERUS      Prior to Admission medications   Medication Sig Start Date End Date Taking? Authorizing Provider  acetaminophen (TYLENOL) 500 MG tablet Take 1,000 mg by mouth every 6 (six) hours as needed for pain.    [provider]  HYDROcodone-acetaminophen (NORCO/VICODIN) 5-325 MG per tablet Take 1 tablet by mouth every 6 (six) hours as needed for pain. 11/14/12   Bethann Berkshire, MD  HYDROcodone-acetaminophen (NORCO/VICODIN) 5-325 MG per tablet 1 or 2 tabs PO q6 hours prn pain 01/25/14   Samuel Jester, DO  ibuprofen (ADVIL,MOTRIN) 200 MG tablet Take 800 mg by mouth every 6 (six) hours as needed for pain.    [provider]  methocarbamol (ROBAXIN) 500 MG tablet Two tabs po TID prn muscle spasms 12/23/12   Triplett, Tammy, PA-C  methocarbamol (ROBAXIN) 500 MG tablet Take 2 tablets (1,000 mg total) by mouth 4 (four) times daily as needed for muscle spasms (muscle spasm/pain). 01/25/14   Samuel Jester, DO   naproxen (NAPROSYN) 500 MG tablet Take 1 tablet (500 mg total) by mouth 2 (two) times daily with a meal. 12/23/12   Triplett, Tammy, PA-C  predniSONE (DELTASONE) 10 MG tablet Take 2 tablets (20 mg total) by mouth daily. 11/08/12   Annamarie Dawley, MD    Allergies  Allergen Reactions  . Latex Hives  . Tomato Hives    Family History  Problem Relation Age of Onset  . Cancer Other     Social History Social History   Tobacco Use  . Smoking status: Never Smoker  . Smokeless tobacco: Never Used  Substance Use Topics  . Alcohol use: No  . Drug use: No    Review of Systems Constitutional: Negative for fever. Eyes: Negative for visual complaints ENT: Positive for left sore throat Cardiovascular: Negative for chest pain. Respiratory: Negative for shortness of breath. Gastrointestinal: Negative for abdominal pain, vomiting  Genitourinary: Negative for urinary compaints Musculoskeletal: Negative for musculoskeletal complaints Skin: Negative for skin complaints  Neurological: Negative for headache All other ROS negative  ____________________________________________   PHYSICAL EXAM:  VITAL SIGNS: ED Triage Vitals [12/02/17 0933]  Enc Vitals Group     BP 127/90     Pulse Rate 91     Resp 18     Temp 98.4 F (36.9 C)     Temp Source Oral     SpO2 99 %     Weight 210 lb (95.3  kg)     Height 5\' 7"  (1.702 m)     Head Circumference      Peak Flow      Pain Score 8     Pain Loc      Pain Edu?      Excl. in GC?    Constitutional: Alert and oriented. Well appearing and in no distress. Eyes: Normal exam ENT   Head: Normocephalic and atraumatic.  Normal tympanic membranes and external auditory canals.   Mouth/Throat: Mucous membranes are moist.  Significant left tonsillar hypertrophy, large right tonsil but not as large as the left tonsil.  Significant erythema on the left tonsil no significant exudate noted.  Midline uvula. Cardiovascular: Normal rate, regular rhythm.  No murmur Respiratory: Normal respiratory effort without tachypnea nor retractions. Breath sounds are clear  Gastrointestinal: Soft and nontender. No distention.   Musculoskeletal: Nontender with normal range of motion in all extremities.  Neurologic:  Normal speech and language. No gross focal neurologic deficits  Skin:  Skin is warm, dry and intact.  Psychiatric: Mood and affect are normal.   ____________________________________________     RADIOLOGY  CT positive for tonsillitis been negative for peritonsillar abscess.  ____________________________________________   INITIAL IMPRESSION / ASSESSMENT AND PLAN / ED COURSE  Pertinent labs & imaging results that were available during my care of the patient were reviewed by me and considered in my medical decision making (see chart for details).  Patient presents to the emergency department for left-sided sore throat.  Differential would include tonsillitis, peritonsillar abscess, pharyngitis.  Examination does show enlarged tonsils left greater than right but the patient appears to have large tonsils at baseline.  The left tonsil is very erythematous.  We will obtain a strep swab, check basic labs and obtain a CT scan of the neck with contrast to evaluate for peritonsillar abscess.  Patient agreeable to this plan of care.  CT negative for abscess.  Labs are largely within normal limits.  Will cover with Keflex.  I discussed return precautions.  Patient agreeable to plan of care. ____________________________________________   FINAL CLINICAL IMPRESSION(S) / ED DIAGNOSES  Tonsillitis    Minna AntisPaduchowski, Anishka Bushard, MD 12/02/17 1137

## 2017-12-02 NOTE — ED Notes (Signed)
Dr. Lenard LancePaduchowski notified of patient presentation.

## 2017-12-13 ENCOUNTER — Encounter: Payer: Self-pay | Admitting: Emergency Medicine

## 2017-12-13 ENCOUNTER — Other Ambulatory Visit: Payer: Self-pay

## 2017-12-13 ENCOUNTER — Emergency Department
Admission: EM | Admit: 2017-12-13 | Discharge: 2017-12-13 | Disposition: A | Discharge status: Home / Self Care | Payer: Medicaid Other | Attending: Emergency Medicine | Admitting: Emergency Medicine

## 2017-12-13 DIAGNOSIS — B373 Candidiasis of vulva and vagina: Secondary | ICD-10-CM | POA: Diagnosis not present

## 2017-12-13 DIAGNOSIS — B3731 Acute candidiasis of vulva and vagina: Secondary | ICD-10-CM

## 2017-12-13 DIAGNOSIS — Z79899 Other long term (current) drug therapy: Secondary | ICD-10-CM | POA: Insufficient documentation

## 2017-12-13 DIAGNOSIS — N898 Other specified noninflammatory disorders of vagina: Secondary | ICD-10-CM | POA: Diagnosis present

## 2017-12-13 LAB — WET PREP, GENITAL
CLUE CELLS WET PREP: NONE SEEN
Sperm: NONE SEEN
TRICH WET PREP: NONE SEEN

## 2017-12-13 MED ORDER — FLUCONAZOLE 150 MG PO TABS: 150.0000 mg | ORAL_TABLET | Freq: Every day | ORAL | 0 refills | Status: DC

## 2017-12-13 MED ORDER — MICONAZOLE NITRATE 2 % VA CREA: 1.0000 | TOPICAL_CREAM | Freq: Every day | VAGINAL | 0 refills | Status: DC

## 2017-12-13 NOTE — ED Notes (Addendum)
Pt states she has been on Cephalexin for the past 3 days, last dose was last night, states she started noticing vaginal irritation and itching. Denies any vaginal discharge.  Pt states she has not been sexually active in 2.5 years.

## 2017-12-13 NOTE — ED Notes (Signed)
First Nurse Note: Patient hospitalized last week, placed on abx - cephalexin 500 mg.  Complaining of vaginal itching at this time.  Denies SHOB.  Speech clear.

## 2017-12-13 NOTE — Discharge Instructions (Addendum)
Follow-up with your primary care provider or [facility] if any continued problems.  [facility]  There are 2 medications.  One is a tablet for yeast the other is a vaginal cream to be used at night for 7 days.

## 2017-12-13 NOTE — ED Provider Notes (Signed)
[facility] Emergency Department Provider Note  ____________________________________________   First MD Initiated Contact with Patient 12/13/17 [phone]     (approximate)  I have reviewed the triage vital signs and the nursing notes.   HISTORY  Chief Complaint No chief complaint on file.  HPI Katie Mills is a 37 y.o. female to the emergency department with complaint of vaginal irritation that began after she was placed on cephalexin for an abscess.  Patient denies any vaginal discharge.  She denies any urinary symptoms.  Past Medical History:  Diagnosis Date  . Anemia   . Eczema   . Headache     There are no active problems to display for this patient.   Past Surgical History:  Procedure Laterality Date  . DILATION AND CURETTAGE OF UTERUS      Prior to Admission medications   Medication Sig Start Date End Date Taking? Authorizing Provider  acetaminophen (TYLENOL) 500 MG tablet Take 1,000 mg by mouth every 6 (six) hours as needed for pain.    [provider]  cephALEXin (KEFLEX) 500 MG capsule Take 1 capsule (500 mg total) by mouth 3 (three) times daily. 12/02/17   Minna AntisPaduchowski, Kevin, MD  fluconazole (DIFLUCAN) 150 MG tablet Take 1 tablet (150 mg total) by mouth daily. 12/13/17   Tommi RumpsSummers, Rhonda L, PA-C  HYDROcodone-acetaminophen (NORCO/VICODIN) 5-325 MG per tablet 1 or 2 tabs PO q6 hours prn pain 01/25/14   Samuel JesterMcManus, Kathleen, DO  miconazole (MONISTAT 7 SIMPLY CURE) 2 % vaginal cream Place 1 Applicatorful vaginally at bedtime. 12/13/17   Tommi RumpsSummers, Rhonda L, PA-C    Allergies Latex and Tomato  Family History  Problem Relation Age of Onset  . Cancer Other     Social History Social History   Tobacco Use  . Smoking status: Never Smoker  . Smokeless tobacco: Never Used  Substance Use Topics  . Alcohol use: No  . Drug use: No    Review of Systems Constitutional: No fever/chills Cardiovascular: Denies chest pain. Respiratory: Denies  shortness of breath. Genitourinary: Positive for vaginal irritation. Musculoskeletal: Negative for back pain. Neurological: Negative for  focal weakness or numbness. ___________________________________________   PHYSICAL EXAM:  VITAL SIGNS: ED Triage Vitals [12/13/17 0835]  Enc Vitals Group     BP      Pulse Rate 77     Resp 16     Temp 97.9 F (36.6 C)     Temp Source Oral     SpO2 100 %     Weight 210 lb (95.3 kg)     Height 5\' 7"  (1.702 m)     Head Circumference      Peak Flow      Pain Score      Pain Loc      Pain Edu?      Excl. in GC?    Constitutional: Alert and oriented. Well appearing and in no acute distress. Eyes: Conjunctivae are normal. Head: Atraumatic. Neck: No stridor.   Cardiovascular: Normal rate, regular rhythm. Grossly normal heart sounds.  Good peripheral circulation. Respiratory: Normal respiratory effort.  No retractions. Lungs CTAB. Genitourinary: Moderate amount of white thick vaginal material present.  No adnexal masses or tenderness is noted.  Otherwise normal external genitalia. Musculoskeletal: His upper and lower extremities without any difficulty.  Normal gait was noted. Neurologic:  Normal speech and language. No gross focal neurologic deficits are appreciated.  Skin:  Skin is warm, dry and intact. Psychiatric: Mood and affect are normal. Speech  and behavior are normal.  ____________________________________________   LABS (all labs ordered are listed, but only abnormal results are displayed)  Labs Reviewed  WET PREP, GENITAL - Abnormal; Notable for the following components:      Result Value   Yeast Wet Prep HPF POC PRESENT (*)    WBC, Wet Prep HPF POC RARE (*)    All other components within normal limits    PROCEDURES  Procedure(s) performed: None  Procedures  Critical Care performed: No  ____________________________________________   INITIAL IMPRESSION / ASSESSMENT AND PLAN / ED COURSE  As part of my medical decision  making, I reviewed the following data within the electronic MEDICAL RECORD NUMBER Notes from prior ED visits and [place] Controlled Substance Database  Patient was given prescription for Diflucan 150 mg 1 p.o. along with Monistat vaginal cream.  She is to follow-up with her PCP or [facility] acute care if any continued problems. ____________________________________________   FINAL CLINICAL IMPRESSION(S) / ED DIAGNOSES  Final diagnoses:  Vaginal yeast infection     ED Discharge Orders        Ordered    fluconazole (DIFLUCAN) 150 MG tablet  Daily     12/13/17 0953    miconazole (MONISTAT 7 SIMPLY CURE) 2 % vaginal cream  Daily at bedtime     12/13/17 1610       Note:  This document was prepared using Dragon voice recognition software and may include unintentional dictation errors.    Tommi Rumps, PA-C 12/13/17 1651    Sharyn Creamer, MD 12/13/17 [phone]

## 2017-12-13 NOTE — ED Triage Notes (Signed)
Recently treated for a throat abscess with "cephlexacin"  C/O vaginal irritation.

## 2018-02-12 ENCOUNTER — Emergency Department (HOSPITAL_COMMUNITY)
Admission: EM | Admit: 2018-02-12 | Discharge: 2018-02-12 | Disposition: A | Discharge status: Home / Self Care | Payer: Medicaid Other | Attending: Emergency Medicine | Admitting: Emergency Medicine

## 2018-02-12 ENCOUNTER — Other Ambulatory Visit: Payer: Self-pay

## 2018-02-12 ENCOUNTER — Emergency Department (HOSPITAL_COMMUNITY): Payer: Medicaid Other

## 2018-02-12 ENCOUNTER — Encounter (HOSPITAL_COMMUNITY): Payer: Self-pay | Admitting: Emergency Medicine

## 2018-02-12 DIAGNOSIS — T07XXXA Unspecified multiple injuries, initial encounter: Secondary | ICD-10-CM | POA: Diagnosis not present

## 2018-02-12 DIAGNOSIS — W108XXA Fall (on) (from) other stairs and steps, initial encounter: Secondary | ICD-10-CM | POA: Diagnosis not present

## 2018-02-12 DIAGNOSIS — Y999 Unspecified external cause status: Secondary | ICD-10-CM | POA: Diagnosis not present

## 2018-02-12 DIAGNOSIS — S63697A Other sprain of left little finger, initial encounter: Secondary | ICD-10-CM

## 2018-02-12 DIAGNOSIS — Y929 Unspecified place or not applicable: Secondary | ICD-10-CM | POA: Diagnosis not present

## 2018-02-12 DIAGNOSIS — Z91040 Latex allergy status: Secondary | ICD-10-CM | POA: Diagnosis not present

## 2018-02-12 DIAGNOSIS — S6992XA Unspecified injury of left wrist, hand and finger(s), initial encounter: Secondary | ICD-10-CM | POA: Diagnosis present

## 2018-02-12 DIAGNOSIS — Z79899 Other long term (current) drug therapy: Secondary | ICD-10-CM | POA: Diagnosis not present

## 2018-02-12 DIAGNOSIS — Y939 Activity, unspecified: Secondary | ICD-10-CM | POA: Diagnosis not present

## 2018-02-12 DIAGNOSIS — T148XXA Other injury of unspecified body region, initial encounter: Secondary | ICD-10-CM

## 2018-02-12 MED ORDER — CYCLOBENZAPRINE HCL 10 MG PO TABS
10.0000 mg | ORAL_TABLET | Freq: Once | ORAL | Status: AC
  Administered 2018-02-12: 10 mg via ORAL
  Filled 2018-02-12: qty 1

## 2018-02-12 MED ORDER — IBUPROFEN 800 MG PO TABS
800.0000 mg | ORAL_TABLET | Freq: Once | ORAL | Status: AC
Start: 2018-02-12 — End: 2018-02-12
  Administered 2018-02-12: 800 mg via ORAL
  Filled 2018-02-12: qty 1

## 2018-02-12 MED ORDER — HYDROCODONE-ACETAMINOPHEN 5-325 MG PO TABS
1.0000 | ORAL_TABLET | Freq: Once | ORAL | Status: AC
Start: 2018-02-12 — End: 2018-02-12
  Administered 2018-02-12: 1 via ORAL
  Filled 2018-02-12: qty 1

## 2018-02-12 MED ORDER — IBUPROFEN 600 MG PO TABS: 600.0000 mg | ORAL_TABLET | Freq: Four times a day (QID) | ORAL | 0 refills | Status: AC

## 2018-02-12 MED ORDER — TRAMADOL HCL 50 MG PO TABS: ORAL_TABLET | ORAL | 0 refills | Status: DC

## 2018-02-12 MED ORDER — ONDANSETRON HCL 4 MG PO TABS
4.0000 mg | ORAL_TABLET | Freq: Once | ORAL | Status: AC
  Administered 2018-02-12: 4 mg via ORAL
  Filled 2018-02-12: qty 1

## 2018-02-12 MED ORDER — CYCLOBENZAPRINE HCL 10 MG PO TABS: 10.0000 mg | ORAL_TABLET | Freq: Three times a day (TID) | ORAL | 0 refills | Status: DC

## 2018-02-12 NOTE — Discharge Instructions (Addendum)
The x-ray of your left hand is negative for any fracture or dislocation.  Your examination favors multiple contusions, and muscle strain.  Please use Flexeril and Motrin daily.  Use Ultram for more severe pain.  Flexeril and Ultram may cause drowsiness.  Please do not drive a vehicle, operate machinery, drink alcohol, or participate in activities requiring concentration when taking either these medications.  Please follow-up with Dr. Kateri Mc in the office if any changes in your condition, problems, or concerns.

## 2018-02-12 NOTE — ED Notes (Signed)
Pt ambulatory, carrying items in both hands.

## 2018-02-12 NOTE — ED Notes (Signed)
Patient given discharge instruction, verbalized understand. Patient ambulatory out of the department.  

## 2018-02-12 NOTE — ED Provider Notes (Signed)
[facility] EMERGENCY DEPARTMENT Provider Note   CSN: [id number] Arrival date & time: 02/12/18  1238     History   Chief Complaint Chief Complaint  Patient presents with  . Fall    HPI Katie Mills is a 37 y.o. female.  Patient is a 37 year old female who presents to the emergency department with an injury following a fall.  The patient states that on yesterday October 3, she fell down about 6 steps, broke her fall with her hands and knees, but her left hand went to slats of the steps.  She broke all the nails on her left hand.  She had pain at multiple sites including her shoulders her knees and her hands.  She took Tylenol last night with only mild relief.  She was even more sore this morning and took Tylenol again.  She says she continues to have pain and discomfort.  She also has a problem with a headache.  She denies hitting her head, but states she has a rather severe headache.  No vomiting.  No visual changes.  She denies injuring her chest or abdomen.  No pelvis pain.  The patient has been ambulatory since the incident.  She is beginning to notice some tightness and discomfort of her lower back as well.  The history is provided by the patient.    Past Medical History:  Diagnosis Date  . Anemia   . Eczema   . Headache     There are no active problems to display for this patient.   Past Surgical History:  Procedure Laterality Date  . DILATION AND CURETTAGE OF UTERUS       OB History    Gravida  9   Para  4   Term  4   Preterm      AB  5   Living  4     SAB  3   TAB  2   Ectopic      Multiple      Live Births               Home Medications    Prior to Admission medications   Medication Sig Start Date End Date Taking? Authorizing Provider  acetaminophen (TYLENOL) 500 MG tablet Take 1,000 mg by mouth every 6 (six) hours as needed for pain.    [provider]  cephALEXin (KEFLEX) 500 MG capsule Take 1 capsule (500 mg total) by  mouth 3 (three) times daily. 12/02/17   Minna Antis, MD  fluconazole (DIFLUCAN) 150 MG tablet Take 1 tablet (150 mg total) by mouth daily. 12/13/17   Tommi Rumps, PA-C  HYDROcodone-acetaminophen (NORCO/VICODIN) 5-325 MG per tablet 1 or 2 tabs PO q6 hours prn pain 01/25/14   Samuel Jester, DO  miconazole (MONISTAT 7 SIMPLY CURE) 2 % vaginal cream Place 1 Applicatorful vaginally at bedtime. 12/13/17   Tommi Rumps, PA-C    Family History Family History  Problem Relation Age of Onset  . Cancer Other     Social History Social History   Tobacco Use  . Smoking status: Never Smoker  . Smokeless tobacco: Never Used  Substance Use Topics  . Alcohol use: No  . Drug use: No     Allergies   Latex and Tomato   Review of Systems Review of Systems  Constitutional: Positive for activity change.       All ROS Neg except as noted in HPI  HENT: Negative for nosebleeds.   Eyes: Negative  for photophobia and discharge.  Respiratory: Negative for cough, shortness of breath and wheezing.   Cardiovascular: Negative for chest pain and palpitations.  Gastrointestinal: Negative for abdominal pain and blood in stool.  Genitourinary: Negative for dysuria, frequency and hematuria.  Musculoskeletal: Positive for arthralgias and back pain. Negative for neck pain.  Skin: Negative.   Neurological: Positive for headaches. Negative for dizziness, seizures and speech difficulty.  Psychiatric/Behavioral: Negative for confusion and hallucinations.     Physical Exam Updated Vital Signs BP (!) 144/97 (BP Location: Right Arm)   Pulse 66   Temp 98 F (36.7 C) (Oral)   Resp 17   Ht 5\' 7"  (1.702 m)   Wt 104.3 kg   SpO2 100%   BMI 36.02 kg/m   Physical Exam  Constitutional: She is oriented to person, place, and time. She appears well-developed and well-nourished.  Non-toxic appearance.  HENT:  Head: Normocephalic.  Right Ear: Tympanic membrane and external ear normal.  Left Ear:  Tympanic membrane and external ear normal.  There are no palpable hematomas of the scalp or forehead.  No facial abrasions appreciated.  Eyes: Pupils are equal, round, and reactive to light. EOM and lids are normal.  Neck: Normal range of motion. Neck supple. Carotid bruit is not present.  Cardiovascular: Normal rate, regular rhythm, normal heart sounds, intact distal pulses and normal pulses.  Pulmonary/Chest: Breath sounds normal. No respiratory distress.  Abdominal: Soft. Bowel sounds are normal. There is no tenderness. There is no guarding.  Musculoskeletal:  Pain to palpation of the left little finger. Pain with attempted movement of the finger. Sorness of the right and left wrist.  Lymphadenopathy:       Head (right side): No submandibular adenopathy present.       Head (left side): No submandibular adenopathy present.    She has no cervical adenopathy.  Neurological: She is alert and oriented to person, place, and time. She has normal strength. No cranial nerve deficit or sensory deficit.  Skin: Skin is warm and dry.  Psychiatric: She has a normal mood and affect. Her speech is normal.  Nursing note and vitals reviewed.    ED Treatments / Results  Labs (all labs ordered are listed, but only abnormal results are displayed) Labs Reviewed - No data to display  EKG None  Radiology No results found.  Procedures Procedures (including critical care time)  Medications Ordered in ED Medications - No data to display   Initial Impression / Assessment and Plan / ED Course  I have reviewed the triage vital signs and the nursing notes.  Pertinent labs & imaging results that were available during my care of the patient were reviewed by me and considered in my medical decision making (see chart for details).       Final Clinical Impressions(s) / ED Diagnoses MDM  Vital signs reviewed.  Pulse oximetry is 100% on room air.  Within normal limits by my interpretation.  The  patient's examination shows soreness of the shoulders.  Tightness and tenseness at the upper trapezius area.  There is pain with palpation and movement of the left little finger.  There is soreness of both wrist and soreness of the knees, but no evidence of effusion or other trauma.  X-ray of the left hand is negative for fracture or dislocation.  I suspect that the patient has a sprain of the little finger.  The examination favors contusion of multiple areas and muscle strain.  The patient will be treated with  anti-inflammatory pain medication muscle relaxer and Ultram for more severe pain if needed.  Pt reviewed [place]MP [place].   Final diagnoses:  Other sprain of left little finger, initial encounter  Multiple contusions  Muscle strain    ED Discharge Orders         Ordered    cyclobenzaprine (FLEXERIL) 10 MG tablet  3 times daily     02/12/18 1611    ibuprofen (ADVIL,MOTRIN) 600 MG tablet  4 times daily     02/12/18 1611    traMADol (ULTRAM) 50 MG tablet     02/12/18 1611           Ivery Quale, PA-C 02/12/18 1624    Bethann Berkshire, MD 02/16/18 1559

## 2018-02-12 NOTE — ED Triage Notes (Signed)
Pt fell at work, catching herself with her hands forward.  Pt c/o of bilateral hand pain and right shoulder pain.  Also states having a headache.

## 2019-11-29 ENCOUNTER — Other Ambulatory Visit: Payer: Self-pay

## 2019-11-29 ENCOUNTER — Encounter (HOSPITAL_COMMUNITY): Payer: Self-pay | Admitting: *Deleted

## 2019-11-29 ENCOUNTER — Emergency Department (HOSPITAL_COMMUNITY)
Admission: EM | Admit: 2019-11-29 | Discharge: 2019-11-30 | Disposition: A | Discharge status: Home / Self Care | Payer: Medicaid Other | Attending: Emergency Medicine | Admitting: Emergency Medicine

## 2019-11-29 ENCOUNTER — Emergency Department (HOSPITAL_COMMUNITY): Payer: Medicaid Other

## 2019-11-29 DIAGNOSIS — R0602 Shortness of breath: Secondary | ICD-10-CM | POA: Diagnosis not present

## 2019-11-29 DIAGNOSIS — F419 Anxiety disorder, unspecified: Secondary | ICD-10-CM | POA: Diagnosis not present

## 2019-11-29 DIAGNOSIS — R002 Palpitations: Secondary | ICD-10-CM | POA: Insufficient documentation

## 2019-11-29 DIAGNOSIS — R079 Chest pain, unspecified: Secondary | ICD-10-CM | POA: Diagnosis present

## 2019-11-29 DIAGNOSIS — Z91040 Latex allergy status: Secondary | ICD-10-CM | POA: Diagnosis not present

## 2019-11-29 LAB — CBC
HCT: 33.6 % — ABNORMAL LOW (ref 36.0–46.0)
Hemoglobin: 10.9 g/dL — ABNORMAL LOW (ref 12.0–15.0)
MCH: 29.4 pg (ref 26.0–34.0)
MCHC: 32.4 g/dL (ref 30.0–36.0)
MCV: 90.6 fL (ref 80.0–100.0)
Platelets: 233 10*3/uL (ref 150–400)
RBC: 3.71 MIL/uL — ABNORMAL LOW (ref 3.87–5.11)
RDW: 12.3 % (ref 11.5–15.5)
WBC: 5.5 10*3/uL (ref 4.0–10.5)
nRBC: 0 % (ref 0.0–0.2)

## 2019-11-29 LAB — BASIC METABOLIC PANEL
Anion gap: 10 (ref 5–15)
BUN: 16 mg/dL (ref 6–20)
CO2: 22 mmol/L (ref 22–32)
Calcium: 8.8 mg/dL — ABNORMAL LOW (ref 8.9–10.3)
Chloride: 103 mmol/L (ref 98–111)
Creatinine, Ser: 0.82 mg/dL (ref 0.44–1.00)
GFR calc Af Amer: >60 mL/min (ref >60)
GFR calc non Af Amer: >60 mL/min (ref >60)
Glucose, Bld: 84 mg/dL (ref 70–99)
Potassium: 3.4 mmol/L — ABNORMAL LOW (ref 3.5–5.1)
Sodium: 135 mmol/L (ref 135–145)

## 2019-11-29 LAB — TROPONIN I (HIGH SENSITIVITY)
Troponin I (High Sensitivity): <2 ng/L (ref <18)
Troponin I (High Sensitivity): <2 ng/L (ref <18)

## 2019-11-29 MED ORDER — SODIUM CHLORIDE 0.9% FLUSH: 3.0000 mL | Freq: Once | INTRAVENOUS | Status: DC

## 2019-11-29 NOTE — ED Triage Notes (Signed)
Pt with mid cp tightness with sob for about an hour, heart palpitations all day.

## 2019-11-29 NOTE — ED Provider Notes (Signed)
[facility] EMERGENCY DEPARTMENT Provider Note   CSN: [id number] Arrival date & time: 11/29/19  1944     History Chief Complaint  Patient presents with  . Chest Pain    Katie Mills is a 39 y.o. female with pertinent past medical history of anemia that presents the emergency department today for chest pain and heart palpitations and shortness of breath.  Patient states that she has been having palpitations for the past 2 years, states that however yesterday started having palpitations along with chest pain and shortness of breath.  Patient states that the symptoms happened again today, lasted about an hour and came to the emergency department.  Patient states that she had her significant other passed away a couple years ago from a heart attack, his birthday was yesterday.  States that she is been under a lot of stress recently, has been only getting 2 to 3 hours of sleep at night.  Denies any caffeine.  Denies any cardiac history.  Denies any chest pain on exertion, pleuritic component, radiation down to her arm.  Denies any numbness and tingling down into her arm.  Denies any jaw pain or neck pain.  Denies any recent long travel, recent surgeries, chance of pregnancy, leg swelling, clotting disorder.  Has not been taking anything for this.  States that she does not think she is ever had a panic attack before.  Denies any family history of any cardiac disease.  Has not started any new medications.  Denies any back pain, abdominal pain, nausea, vomiting, diaphoresis, paresthesias, weakness, gait changes, fevers, chills, URI symptoms, weight changes.  Has never seen a cardiologist.  HPI     Past Medical History:  Diagnosis Date  . Anemia   . Eczema   . Headache     There are no problems to display for this patient.   Past Surgical History:  Procedure Laterality Date  . DILATION AND CURETTAGE OF UTERUS       OB History    Gravida  9   Para  4   Term  4   Preterm      AB  5     Living  4     SAB  3   TAB  2   Ectopic      Multiple      Live Births              Family History  Problem Relation Age of Onset  . Cancer Other     Social History   Tobacco Use  . Smoking status: Never Smoker  . Smokeless tobacco: Never Used  Substance Use Topics  . Alcohol use: No  . Drug use: No    Home Medications Prior to Admission medications   Medication Sig Start Date End Date Taking? Authorizing Provider  acetaminophen (TYLENOL) 500 MG tablet Take 1,000 mg by mouth every 6 (six) hours as needed for pain.    [provider]  cephALEXin (KEFLEX) 500 MG capsule Take 1 capsule (500 mg total) by mouth 3 (three) times daily. 12/02/17   Minna Antis, MD  cyclobenzaprine (FLEXERIL) 10 MG tablet Take 1 tablet (10 mg total) by mouth 3 (three) times daily. 02/12/18   Ivery Quale, PA-C  fluconazole (DIFLUCAN) 150 MG tablet Take 1 tablet (150 mg total) by mouth daily. 12/13/17   Tommi Rumps, PA-C  HYDROcodone-acetaminophen (NORCO/VICODIN) 5-325 MG per tablet 1 or 2 tabs PO q6 hours prn pain 01/25/14   Samuel Jester,  DO  ibuprofen (ADVIL,MOTRIN) 600 MG tablet Take 1 tablet (600 mg total) by mouth 4 (four) times daily. 02/12/18   Ivery Quale, PA-C  miconazole (MONISTAT 7 SIMPLY CURE) 2 % vaginal cream Place 1 Applicatorful vaginally at bedtime. 12/13/17   Tommi Rumps, PA-C  traMADol Janean Sark) 50 MG tablet 1 or 2 po q6h prn pain 02/12/18   Ivery Quale, PA-C    Allergies    Latex and Tomato  Review of Systems   Review of Systems  Constitutional: Negative for chills, diaphoresis, fatigue and fever.  HENT: Negative for congestion, sore throat and trouble swallowing.   Eyes: Negative for pain and visual disturbance.  Respiratory: Positive for shortness of breath. Negative for cough and wheezing.   Cardiovascular: Positive for chest pain and palpitations. Negative for leg swelling.  Gastrointestinal: Negative for abdominal distention,  abdominal pain, diarrhea, nausea and vomiting.  Genitourinary: Negative for difficulty urinating.  Musculoskeletal: Negative for back pain, neck pain and neck stiffness.  Skin: Negative for pallor.  Neurological: Negative for dizziness, speech difficulty, weakness and headaches.  Psychiatric/Behavioral: Negative for confusion.    Physical Exam Updated Vital Signs BP 121/87   Pulse 72   Temp 97.8 F (36.6 C) (Oral)   Resp (!) 23   Ht 5' 7.5" (1.715 m)   Wt 117.9 kg   LMP 11/15/2019   SpO2 100%   BMI 40.12 kg/m   Physical Exam Constitutional:      General: She is not in acute distress.    Appearance: Normal appearance. She is not ill-appearing, toxic-appearing or diaphoretic.  HENT:     Head: Normocephalic and atraumatic.     Mouth/Throat:     Mouth: Mucous membranes are moist.     Pharynx: Oropharynx is clear.  Eyes:     General: No scleral icterus.    Extraocular Movements: Extraocular movements intact.     Pupils: Pupils are equal, round, and reactive to light.  Cardiovascular:     Rate and Rhythm: Normal rate and regular rhythm.     Pulses: Normal pulses.     Heart sounds: Normal heart sounds.  Pulmonary:     Effort: Pulmonary effort is normal. No respiratory distress.     Breath sounds: Normal breath sounds. No stridor. No wheezing, rhonchi or rales.  Chest:     Chest wall: No tenderness.  Abdominal:     General: Abdomen is flat. There is no distension.     Palpations: Abdomen is soft.     Tenderness: There is no abdominal tenderness. There is no guarding or rebound.  Musculoskeletal:        General: No swelling or tenderness. Normal range of motion.     Cervical back: Normal range of motion and neck supple. No rigidity or tenderness.     Right lower leg: No edema.     Left lower leg: No edema.  Skin:    General: Skin is warm and dry.     Capillary Refill: Capillary refill takes less than 2 seconds.     Coloration: Skin is not pale.  Neurological:      General: No focal deficit present.     Mental Status: She is alert and oriented to person, place, and time.     Comments: Alert. Clear speech. No facial droop. CNIII-XII grossly intact. Bilateral upper and lower extremities' sensation grossly intact. 5/5 symmetric strength with grip strength and with plantar and dorsi flexion bilaterally. Patellar DTRs are 2+ and symmetric .  Psychiatric:        Mood and Affect: Mood normal.        Behavior: Behavior normal.     ED Results / Procedures / Treatments   Labs (all labs ordered are listed, but only abnormal results are displayed) Labs Reviewed  BASIC METABOLIC PANEL - Abnormal; Notable for the following components:      Result Value   Potassium 3.4 (*)    Calcium 8.8 (*)    All other components within normal limits  CBC - Abnormal; Notable for the following components:   RBC 3.71 (*)    Hemoglobin 10.9 (*)    HCT 33.6 (*)    All other components within normal limits  TROPONIN I (HIGH SENSITIVITY)  TROPONIN I (HIGH SENSITIVITY)    EKG EKG Interpretation  Date/Time:  Tuesday November 29 2019 19:54:25 EDT Ventricular Rate:  79 PR Interval:    QRS Duration: 89 QT Interval:  382 QTC Calculation: 438 R Axis:   46 Text Interpretation: Sinus rhythm ST elev, probable normal early repol pattern Confirmed by Raeford RazorKohut, Stephen [phone]([id number]) on 11/29/2019 10:50:52 PM   Radiology DG Chest Portable 1 View  Result Date: 11/29/2019 CLINICAL DATA:  Chest pain, shortness of breath EXAM: PORTABLE CHEST 1 VIEW COMPARISON:  Radiograph 01/25/2014 FINDINGS: No consolidation, features of edema, pneumothorax, or effusion. Pulmonary vascularity is normally distributed. The cardiomediastinal contours are unremarkable. No acute osseous or soft tissue abnormality. Telemetry leads overlie the chest. IMPRESSION: No acute cardiopulmonary abnormality. Electronically Signed   By: Kreg ShropshirePrice  DeHay M.D.   On: 11/29/2019 20:18    Procedures Procedures (including critical care  time)  Medications Ordered in ED Medications  sodium chloride flush (NS) 0.9 % injection 3 mL (3 mLs Intravenous Not Given 11/29/19 2025)    ED Course  I have reviewed the triage vital signs and the nursing notes.  Pertinent labs & imaging results that were available during my care of the patient were reviewed by me and considered in my medical decision making (see chart for details).    MDM Rules/Calculators/A&P                         Katie Mills is a 39 y.o. female with pertinent past medical history of anemia that presents the emergency department today for chest pain and heart palpitations and shortness of breath. The emergent causes of chest pain include: Acute coronary syndrome, tamponade, pericarditis/myocarditis, aortic dissection, pulmonary embolism, tension pneumothorax, pneumonia, and esophageal rupture.  Patient not currently having any chest pain, does not want any pain medication at this time.  CBC and CMP without any acute abnormalities.  CBC with hemoglobin of 10.9, this is stable to prior of 7 years ago, patient does have history of anemia.  For troponin less than 2.  Second troponin less than 2.  EKG without any signs of ischemia.  Patient to be discharged, I think that this is related to anxiety, however will refer to cardiology at this time due to ongoing palpitations.HEAR score 1.   Patient is to be discharged with recommendation to follow up with PCP in regards to today's hospital visit. Chest pain is not likely of cardiac or pulmonary etiology d/t presentation, PERC negative, VSS, no tracheal deviation, no JVD or new murmur, RRR, breath sounds equal bilaterally, EKG without acute abnormalities, negative troponin, and negative CXR. Pt has been advised to return to the ED if CP becomes exertional, associated with diaphoresis or nausea, radiates to  left jaw/arm, worsens or becomes concerning in any way. Doubt need for further emergent work up at this time. I explained the  diagnosis and have given explicit precautions to return to the ER including for any other new or worsening symptoms. The patient understands and accepts the medical plan as it's been dictated and I have answered their questions. Discharge instructions concerning home care and prescriptions have been given. The patient is STABLE and is discharged to home in good condition.    Final Clinical Impression(s) / ED Diagnoses Final diagnoses:  Anxiety    Rx / DC Orders ED Discharge Orders    None       Farrel Gordon, PA-C 11/29/19 2341    Raeford Razor, MD 12/04/19 1530

## 2019-11-29 NOTE — Discharge Instructions (Addendum)
You are seen today for chest pain, as we discussed I think this is most likely due to anxiety.  Your work-up was reassuring.  I do want you to see a cardiologist since you have been having palpitations for 2 years, I want you to call the number that I attached.  Use the attached instructions about managing anxiety.  Follow-up with your primary care in the next couple of days.  If you start having any worsening chest pain, radiation, chest pain on exertion I need you to come back to the emergency department.  Your potassium was slightly low, you can replenish this by eating high potassium rich foods like bananas.  I also want you to follow-up with your primary care about your anemia.

## 2019-11-30 NOTE — ED Notes (Signed)
Pt ready go to. Updated v/s and signature not obtained.

## 2020-03-02 ENCOUNTER — Other Ambulatory Visit: Payer: Self-pay

## 2020-03-02 ENCOUNTER — Emergency Department (HOSPITAL_COMMUNITY)
Admission: EM | Admit: 2020-03-02 | Discharge: 2020-03-02 | Discharge status: Against Medical Advice | Payer: Medicaid Other

## 2020-03-02 NOTE — ED Triage Notes (Signed)
Left after waiting 16 minutes

## 2020-12-05 ENCOUNTER — Emergency Department (HOSPITAL_BASED_OUTPATIENT_CLINIC_OR_DEPARTMENT_OTHER)
Admission: EM | Admit: 2020-12-05 | Discharge: 2020-12-05 | Disposition: A | Discharge status: Home / Self Care | Payer: Medicaid Other | Attending: Emergency Medicine | Admitting: Emergency Medicine

## 2020-12-05 ENCOUNTER — Encounter (HOSPITAL_BASED_OUTPATIENT_CLINIC_OR_DEPARTMENT_OTHER): Payer: Self-pay

## 2020-12-05 ENCOUNTER — Other Ambulatory Visit: Payer: Self-pay

## 2020-12-05 ENCOUNTER — Emergency Department (HOSPITAL_BASED_OUTPATIENT_CLINIC_OR_DEPARTMENT_OTHER): Payer: Medicaid Other

## 2020-12-05 DIAGNOSIS — S93402A Sprain of unspecified ligament of left ankle, initial encounter: Secondary | ICD-10-CM | POA: Diagnosis not present

## 2020-12-05 DIAGNOSIS — S8251XA Displaced fracture of medial malleolus of right tibia, initial encounter for closed fracture: Secondary | ICD-10-CM | POA: Diagnosis not present

## 2020-12-05 DIAGNOSIS — Z91040 Latex allergy status: Secondary | ICD-10-CM | POA: Diagnosis not present

## 2020-12-05 DIAGNOSIS — W108XXA Fall (on) (from) other stairs and steps, initial encounter: Secondary | ICD-10-CM | POA: Diagnosis not present

## 2020-12-05 DIAGNOSIS — S82891A Other fracture of right lower leg, initial encounter for closed fracture: Secondary | ICD-10-CM

## 2020-12-05 DIAGNOSIS — S99911A Unspecified injury of right ankle, initial encounter: Secondary | ICD-10-CM | POA: Diagnosis present

## 2020-12-05 NOTE — ED Notes (Signed)
Patient also endorses Pain to Left Ankle.

## 2020-12-05 NOTE — ED Provider Notes (Signed)
[facility] EMERGENCY DEPT Provider Note   CSN: [id number] Arrival date & time: 12/05/20  0305     History Chief Complaint  Patient presents with   Fall   Ankle Pain    Bilateral    Katie Mills is a 40 y.o. female.  The history is provided by the patient.  Fall  Ankle Pain She has history of eczema, and comes in having injured her right ankle.  She tripped going down some steps and thinks her foot got caught.  She is complaining of pain in the right ankle both medially and laterally.  She noted significant swelling almost immediately.  She also has less severe pain in her left ankle, and entirely on the lateral aspect.  She rates her pain at 7/10, but is much worse if she tries to bear weight.  She is unable to bear weight on the right ankle.  She is able to bear weight on the left ankle.  She denies other injury.   Past Medical History:  Diagnosis Date   Anemia    Eczema    Headache     There are no problems to display for this patient.   Past Surgical History:  Procedure Laterality Date   DILATION AND CURETTAGE OF UTERUS       OB History     Gravida  9   Para  4   Term  4   Preterm      AB  5   Living  4      SAB  3   IAB  2   Ectopic      Multiple      Live Births              Family History  Problem Relation Age of Onset   Cancer Other     Social History   Tobacco Use   Smoking status: Never   Smokeless tobacco: Never  Substance Use Topics   Alcohol use: No   Drug use: No    Home Medications Prior to Admission medications   Medication Sig Start Date End Date Taking? Authorizing Provider  acetaminophen (TYLENOL) 500 MG tablet Take 1,000 mg by mouth every 6 (six) hours as needed for pain.    [provider]  cyclobenzaprine (FLEXERIL) 10 MG tablet Take 1 tablet (10 mg total) by mouth 3 (three) times daily. 02/12/18   Ivery Quale, PA-C  HYDROcodone-acetaminophen (NORCO/VICODIN) 5-325 MG per tablet 1  or 2 tabs PO q6 hours prn pain 01/25/14   Samuel Jester, DO  ibuprofen (ADVIL,MOTRIN) 600 MG tablet Take 1 tablet (600 mg total) by mouth 4 (four) times daily. 02/12/18   Ivery Quale, PA-C  traMADol Janean Sark) 50 MG tablet 1 or 2 po q6h prn pain 02/12/18   Ivery Quale, PA-C    Allergies    Latex and Tomato  Review of Systems   Review of Systems  All other systems reviewed and are negative.  Physical Exam Updated Vital Signs BP 124/80 (BP Location: Right Arm)   Pulse 88   Temp 98.2 F (36.8 C) (Oral)   Resp 16   Ht 5\' 8"  (1.727 m)   Wt 117.9 kg   SpO2 100%   BMI 39.53 kg/m   Physical Exam Vitals and nursing note reviewed.  40 year old female, resting comfortably and in no acute distress. Vital signs are normal. Oxygen saturation is 100%, which is normal. Head is normocephalic and atraumatic. PERRLA, EOMI. Oropharynx is clear.  Neck is nontender and supple without adenopathy or JVD. Back is nontender and there is no CVA tenderness. Lungs are clear without rales, wheezes, or rhonchi. Chest is nontender. Heart has regular rate and rhythm without murmur. Abdomen is soft, flat, nontender without masses or hepatosplenomegaly and peristalsis is normoactive. Extremities: Right ankle has moderate swelling diffusely and is also tender diffusely.  There is no instability of the ankle mortise, and the anterior drawer sign is negative.  Dorsalis pedis pulses strong, and there is prompt capillary refill and normal sensation.  Left ankle has some mild swelling in the lateral aspect and tenderness well localized to the lateral malleolus.  There is no tenderness over the fibulotalar ligaments and there is no tenderness medially.  There is no instability the ankle mortise.  Anterior drawer sign is negative.  Dorsalis pedis pulses strong, capillary refill is prompt, normal sensation noted. Skin is warm and dry without rash. Neurologic: Mental status is normal, cranial nerves are intact, there  are no motor or sensory deficits.  ED Results / Procedures / Treatments    Radiology DG Ankle Complete Left  Result Date: 12/05/2020 CLINICAL DATA:  Fall. EXAM: LEFT ANKLE COMPLETE - 3+ VIEW COMPARISON:  No prior. FINDINGS: Diffuse soft tissue swelling. No acute bony or joint abnormality. No evidence of fracture or dislocation. IMPRESSION: Diffuse soft tissue swelling.  No acute bony abnormality. Electronically Signed   By: Maisie Fus  Register   On: 12/05/2020 05:28   DG Ankle Complete Right  Result Date: 12/05/2020 CLINICAL DATA:  Fall. EXAM: RIGHT ANKLE - COMPLETE 3+ VIEW COMPARISON:  10/16/2015. FINDINGS: Diffuse soft tissue swelling. Tiny fracture fragment noted adjacent to the distal tip of the medial malleolus. Age is undetermined. Remainder of the right ankle is intact. IMPRESSION: Diffuse soft tissue swelling. Tiny fracture fragment noted adjacent to the distal tip of the medial malleolus. Age is undetermined. Electronically Signed   By: Maisie Fus  Register   On: 12/05/2020 05:27    Procedures .Ortho Injury Treatment  Date/Time: 12/05/2020 5:50 AM Performed by: Dione Booze, MD Authorized by: Dione Booze, MD   Consent:    Consent obtained:  Verbal   Consent given by:  Patient   Risks discussed:  Stiffness   Alternatives discussed:  Alternative treatmentInjury location: ankle Location details: right ankle Injury type: fracture Pre-procedure neurovascular assessment: neurovascularly intact Pre-procedure distal perfusion: normal Pre-procedure neurological function: normal Pre-procedure range of motion: reduced  Anesthesia: Local anesthesia used: no  Patient sedated: NoManipulation performed: no Immobilization: CAM Walker. Splint Applied by: ED Nurse Supplies used: CAM Walker. Post-procedure distal perfusion: normal Post-procedure neurological function: normal Post-procedure range of motion: unchanged     Medications Ordered in ED Medications - No data to display  ED  Course  I have reviewed the triage vital signs and the nursing notes.  Pertinent imaging results that were available during my care of the patient were reviewed by me and considered in my medical decision making (see chart for details).   MDM Rules/Calculators/A&P                         Fall with injury to both ankles.  X-rays have been ordered.  Old records are reviewed, and she has no relevant past visits.  X-rays show avulsion fracture of the medial malleolus of the right ankle.  She is placed in a cam walker and given crutches and referred to orthopedics for follow-up.  She was offered a prescription for narcotic pain  prescription and has declined.  Final Clinical Impression(s) / ED Diagnoses Final diagnoses:  Fall down stairs, initial encounter  Avulsion fracture of right ankle, closed, initial encounter  Sprain of left ankle, initial encounter    Rx / DC Orders ED Discharge Orders     None        Dione Booze, MD 12/05/20 [phone]

## 2020-12-05 NOTE — ED Triage Notes (Signed)
Patient here POV from Home after Fall. Patient fell approximately at 2300 last PM.   Patient was ambulating down stairs when patient slipped and fell approximately 6-7 carpeted steps.  Patient endorses Pain and Swelling to R. Ankle. Patient applied Ice and took 600 mg Ibuprofen at time of Fall.  Non-Weight Bearing on R. Ankle. A&Ox4. GCS 15. No Hx of Blood Thinners. No Head Injury. No LOC.

## 2021-03-27 ENCOUNTER — Other Ambulatory Visit: Payer: Self-pay

## 2021-03-27 ENCOUNTER — Encounter (HOSPITAL_BASED_OUTPATIENT_CLINIC_OR_DEPARTMENT_OTHER): Payer: Self-pay

## 2021-03-27 ENCOUNTER — Emergency Department (HOSPITAL_BASED_OUTPATIENT_CLINIC_OR_DEPARTMENT_OTHER)
Admission: EM | Admit: 2021-03-27 | Discharge: 2021-03-27 | Disposition: A | Discharge status: Home / Self Care | Payer: Medicaid Other | Attending: Emergency Medicine | Admitting: Emergency Medicine

## 2021-03-27 DIAGNOSIS — U071 COVID-19: Secondary | ICD-10-CM | POA: Insufficient documentation

## 2021-03-27 DIAGNOSIS — J069 Acute upper respiratory infection, unspecified: Secondary | ICD-10-CM | POA: Diagnosis not present

## 2021-03-27 DIAGNOSIS — Z91040 Latex allergy status: Secondary | ICD-10-CM | POA: Diagnosis not present

## 2021-03-27 DIAGNOSIS — Z20822 Contact with and (suspected) exposure to covid-19: Secondary | ICD-10-CM

## 2021-03-27 DIAGNOSIS — R059 Cough, unspecified: Secondary | ICD-10-CM | POA: Diagnosis present

## 2021-03-27 DIAGNOSIS — Z20818 Contact with and (suspected) exposure to other bacterial communicable diseases: Secondary | ICD-10-CM

## 2021-03-27 LAB — RESP PANEL BY RT-PCR (FLU A&B, COVID) ARPGX2
Influenza A by PCR: NEGATIVE
Influenza B by PCR: NEGATIVE
SARS Coronavirus 2 by RT PCR: POSITIVE — AB

## 2021-03-27 MED ORDER — DEXAMETHASONE 4 MG PO TABS
10.0000 mg | ORAL_TABLET | Freq: Once | ORAL | Status: AC
  Administered 2021-03-27: 10 mg via ORAL
  Filled 2021-03-27: qty 3

## 2021-03-27 MED ORDER — AMOXICILLIN 500 MG PO CAPS
1000.0000 mg | ORAL_CAPSULE | Freq: Once | ORAL | Status: AC
  Administered 2021-03-27: 1000 mg via ORAL
  Filled 2021-03-27: qty 2

## 2021-03-27 MED ORDER — AMOXICILLIN 500 MG PO CAPS: 1000.0000 mg | ORAL_CAPSULE | Freq: Two times a day (BID) | ORAL | 0 refills | Status: DC

## 2021-03-27 NOTE — ED Provider Notes (Signed)
[facility] EMERGENCY DEPT Provider Note   CSN: [id number] Arrival date & time: 03/27/21  0415     History Chief Complaint  Patient presents with   Influenza    Katie Mills is a 40 y.o. female.  The history is provided by the patient.  Influenza She has history of eczema and comes in because of sore throat and nonproductive cough for the last 2 days.  She has not run a fever nor had any chills or sweats.  There has been no nausea, vomiting, diarrhea.  She has not had any body aches.  She does have a child living with her who has been diagnosed with strep throat and also with COVID-19.   Past Medical History:  Diagnosis Date   Anemia    Eczema    Headache     There are no problems to display for this patient.   Past Surgical History:  Procedure Laterality Date   DILATION AND CURETTAGE OF UTERUS       OB History     Gravida  9   Para  4   Term  4   Preterm      AB  5   Living  4      SAB  3   IAB  2   Ectopic      Multiple      Live Births              Family History  Problem Relation Age of Onset   Cancer Other     Social History   Tobacco Use   Smoking status: Never   Smokeless tobacco: Never  Substance Use Topics   Alcohol use: No   Drug use: No    Home Medications Prior to Admission medications   Medication Sig Start Date End Date Taking? Authorizing Provider  acetaminophen (TYLENOL) 500 MG tablet Take 1,000 mg by mouth every 6 (six) hours as needed for pain.    [provider]  ibuprofen (ADVIL,MOTRIN) 600 MG tablet Take 1 tablet (600 mg total) by mouth 4 (four) times daily. 02/12/18   Ivery Quale, PA-C    Allergies    Latex and Tomato  Review of Systems   Review of Systems  All other systems reviewed and are negative.  Physical Exam Updated Vital Signs BP 126/85 (BP Location: Right Arm)   Pulse 86   Temp 98.2 F (36.8 C) (Oral)   Resp 14   Ht 5' 7.5" (1.715 m)   Wt 120.2 kg   LMP  02/23/2021   SpO2 100%   BMI 40.89 kg/m   Physical Exam Vitals and nursing note reviewed.  40 year old female, resting comfortably and in no acute distress. Vital signs are normal. Oxygen saturation is 100%, which is normal. Head is normocephalic and atraumatic. PERRLA, EOMI. Oropharynx is clear. Neck is nontender and supple without adenopathy or JVD. Back is nontender and there is no CVA tenderness. Lungs are clear without rales, wheezes, or rhonchi. Chest is nontender. Heart has regular rate and rhythm without murmur. Abdomen is soft, flat, nontender. Extremities have no cyanosis or edema, full range of motion is present. Skin is warm and dry without rash. Neurologic: Mental status is normal, cranial nerves are intact, there are no motor or sensory deficits moves all extremities equally..  ED Results / Procedures / Treatments   Labs (all labs ordered are listed, but only abnormal results are displayed) Labs Reviewed  RESP PANEL BY RT-PCR (FLU A&B,  COVID) ARPGX2   Procedures Procedures   Medications Ordered in ED Medications  amoxicillin (AMOXIL) capsule 1,000 mg (has no administration in time range)  dexamethasone (DECADRON) tablet 10 mg (has no administration in time range)    ED Course  I have reviewed the triage vital signs and the nursing notes.  Pertinent lab results that were available during my care of the patient were reviewed by me and considered in my medical decision making (see chart for details).   MDM Rules/Calculators/A&P                         Respiratory tract infection, probably viral.  With known exposure to both streptococcal infection and COVID-19 infection, it is assumed that patient has both.  She states that she needs COVID test for her place of employment, so one is ordered.  She is given a dose of dexamethasone and discharged with prescription for amoxicillin.  Advised to isolate for 5 days because of presumed COVID-19 infection.  Old records are  reviewed, and she has no relevant past visits.  Final Clinical Impression(s) / ED Diagnoses Final diagnoses:  Viral URI with cough  Exposure to confirmed case of COVID-19  Exposure to strep throat    Rx / DC Orders ED Discharge Orders     None        Dione Booze, MD 03/27/21 [phone]

## 2021-03-27 NOTE — ED Triage Notes (Signed)
Complains with sore throat, cough and runny nose.

## 2021-03-27 NOTE — Discharge Instructions (Signed)
Drink plenty of fluids.  Take ibuprofen and/or acetaminophen as needed for fever or aching.  You need to isolate yourself for 5 days, longer if you continue to run fever or have significant cough.

## 2021-04-04 ENCOUNTER — Other Ambulatory Visit: Payer: Self-pay

## 2021-04-04 ENCOUNTER — Encounter (HOSPITAL_BASED_OUTPATIENT_CLINIC_OR_DEPARTMENT_OTHER): Payer: Self-pay

## 2021-04-04 ENCOUNTER — Emergency Department (HOSPITAL_BASED_OUTPATIENT_CLINIC_OR_DEPARTMENT_OTHER)
Admission: EM | Admit: 2021-04-04 | Discharge: 2021-04-04 | Disposition: A | Discharge status: Home / Self Care | Payer: Medicaid Other | Attending: Emergency Medicine | Admitting: Emergency Medicine

## 2021-04-04 DIAGNOSIS — J029 Acute pharyngitis, unspecified: Secondary | ICD-10-CM | POA: Insufficient documentation

## 2021-04-04 DIAGNOSIS — Z91040 Latex allergy status: Secondary | ICD-10-CM | POA: Insufficient documentation

## 2021-04-04 DIAGNOSIS — U071 COVID-19: Secondary | ICD-10-CM | POA: Insufficient documentation

## 2021-04-04 MED ORDER — PREDNISONE 50 MG PO TABS: 50.0000 mg | ORAL_TABLET | Freq: Every day | ORAL | 0 refills | Status: AC

## 2021-04-04 MED ORDER — DOXYCYCLINE HYCLATE 100 MG PO TABS
100.0000 mg | ORAL_TABLET | Freq: Once | ORAL | Status: AC
  Administered 2021-04-04: 100 mg via ORAL
  Filled 2021-04-04: qty 1

## 2021-04-04 MED ORDER — PREDNISONE 50 MG PO TABS
60.0000 mg | ORAL_TABLET | Freq: Once | ORAL | Status: AC
  Administered 2021-04-04: 60 mg via ORAL
  Filled 2021-04-04: qty 1

## 2021-04-04 MED ORDER — DOXYCYCLINE HYCLATE 100 MG PO CAPS: 100.0000 mg | ORAL_CAPSULE | Freq: Two times a day (BID) | ORAL | 0 refills | Status: AC

## 2021-04-04 NOTE — ED Triage Notes (Signed)
Pt reports sore throat worsening 3 days after 11/16 visit. Was started on abx since family members tested positive for strept. Covid + since 11/16. C/o worsening sore throat and a new "lump" on chest that began today.

## 2021-04-04 NOTE — ED Provider Notes (Signed)
[facility] EMERGENCY DEPT Provider Note   CSN: [id number] Arrival date & time: 04/04/21  2242     History Chief Complaint  Patient presents with   Sore Throat    Katie Mills is a 40 y.o. female.  The history is provided by the patient.  Sore Throat She has history of anemia and has been on amoxicillin for 8 days for strep exposure.  She also was exposed to COVID-19.  She was seen in emergency department and given a dose of dexamethasone.  About 3 days later, her throat started hurting worse and has been getting progressively worse each day.  She is still able to swallow but it is painful.  She has been able to maintain hydration.  She denies fever, chills, sweats.  There has been no cough.  There has been no vomiting or diarrhea.  She denies arthralgias or myalgias.  She also has noted a lump in her anterior chest, but she denies any difficulty breathing,m and denies any chest pain.  She did test COVID-positive..   Past Medical History:  Diagnosis Date   Anemia    Eczema    Headache     There are no problems to display for this patient.   Past Surgical History:  Procedure Laterality Date   DILATION AND CURETTAGE OF UTERUS       OB History     Gravida  9   Para  4   Term  4   Preterm      AB  5   Living  4      SAB  3   IAB  2   Ectopic      Multiple      Live Births              Family History  Problem Relation Age of Onset   Cancer Other     Social History   Tobacco Use   Smoking status: Never   Smokeless tobacco: Never  Substance Use Topics   Alcohol use: No   Drug use: No    Home Medications Prior to Admission medications   Medication Sig Start Date End Date Taking? Authorizing Provider  acetaminophen (TYLENOL) 500 MG tablet Take 1,000 mg by mouth every 6 (six) hours as needed for pain.    [provider]  amoxicillin (AMOXIL) 500 MG capsule Take 2 capsules (1,000 mg total) by mouth 2 (two) times daily.  03/27/21   Dione Booze, MD  ibuprofen (ADVIL,MOTRIN) 600 MG tablet Take 1 tablet (600 mg total) by mouth 4 (four) times daily. 02/12/18   Ivery Quale, PA-C    Allergies    Latex, Nickel, and Tomato  Review of Systems   Review of Systems  All other systems reviewed and are negative.  Physical Exam Updated Vital Signs BP (!) 149/93 (BP Location: Right Arm)   Pulse 87   Temp 97.6 F (36.4 C) (Oral)   Resp 18   Ht 5' 7.5" (1.715 m)   Wt 126 kg   LMP 04/04/2021 (Exact Date)   SpO2 100%   BMI 42.86 kg/m   Physical Exam Vitals and nursing note reviewed.  40 year old female, resting comfortably and in no acute distress. Vital signs are significant for elevated blood pressure. Oxygen saturation is 100%, which is normal. Head is normocephalic and atraumatic. PERRLA, EOMI. Oropharynx shows mild tonsillar erythema and hypertrophy without exudate.  There is no pooling of secretions, phonation is normal. Neck is nontender and  supple without adenopathy or JVD. Back is nontender and there is no CVA tenderness. Lungs are clear without rales, wheezes, or rhonchi. Chest is nontender. Heart has regular rate and rhythm without murmur. Abdomen is soft, flat, nontender. Extremities have no cyanosis or edema, full range of motion is present. Skin is warm and dry without rash. Neurologic: Mental status is normal, cranial nerves are intact, moves all extremities equally.  ED Results / Procedures / Treatments    Procedures Procedures   Medications Ordered in ED Medications  doxycycline (VIBRA-TABS) tablet 100 mg (100 mg Oral Given 04/04/21 2331)  predniSONE (DELTASONE) tablet 60 mg (60 mg Oral Given 04/04/21 2331)    ED Course  I have reviewed the triage vital signs and the nursing notes.  MDM Rules/Calculators/A&P                         Sore throat which has progressed in spite of treatment with amoxicillin and patient who also tested positive for COVID.  Clearly, strep is not  causing her current symptoms, possibly a superinfection with another bacteria, possibly part of COVID infection.  Old records reviewed confirming ED visit on 11/16 at which time prescription was given for amoxicillin and a single dose of dexamethasone given.  The lump that she noted on her chest is actually her manubrium, patient was reassured that this is not anything to be concerned about.  She is advised to stop taking amoxicillin, discharged with prescription for doxycycline and will be given a 5-day course of prednisone.  Return precautions discussed.  Final Clinical Impression(s) / ED Diagnoses Final diagnoses:  Pharyngitis, unspecified etiology  COVID-19 virus infection    Rx / DC Orders ED Discharge Orders          Ordered    predniSONE (DELTASONE) 50 MG tablet  Daily        04/04/21 2330    doxycycline (VIBRAMYCIN) 100 MG capsule  2 times daily        04/04/21 2330             Dione Booze, MD 04/04/21 2338

## 2021-04-04 NOTE — Discharge Instructions (Addendum)
Drink plenty fluids.  Take ibuprofen and/or acetaminophen as needed for fever or aching.  Use lozenges, throat sprays, warm salt water gargles as needed.  All of these give temporary relief without actually fixing the underlying problem.  Return if you start having difficulty swallowing.

## 2021-07-15 ENCOUNTER — Emergency Department (HOSPITAL_BASED_OUTPATIENT_CLINIC_OR_DEPARTMENT_OTHER)
Admission: EM | Admit: 2021-07-15 | Discharge: 2021-07-15 | Disposition: A | Discharge status: Home / Self Care | Payer: Medicaid Other | Attending: Emergency Medicine | Admitting: Emergency Medicine

## 2021-07-15 ENCOUNTER — Other Ambulatory Visit: Payer: Self-pay

## 2021-07-15 ENCOUNTER — Encounter (HOSPITAL_BASED_OUTPATIENT_CLINIC_OR_DEPARTMENT_OTHER): Payer: Self-pay

## 2021-07-15 DIAGNOSIS — L309 Dermatitis, unspecified: Secondary | ICD-10-CM | POA: Insufficient documentation

## 2021-07-15 DIAGNOSIS — R21 Rash and other nonspecific skin eruption: Secondary | ICD-10-CM | POA: Diagnosis present

## 2021-07-15 MED ORDER — TRIAMCINOLONE ACETONIDE 0.1 % EX CREA: 1.0000 "application " | TOPICAL_CREAM | Freq: Two times a day (BID) | CUTANEOUS | 0 refills | Status: AC

## 2021-07-15 NOTE — Discharge Instructions (Addendum)
It was a pleasure taking care of you today! ? ?You may follow-up with your primary care provider as needed regarding today's ED visit.  Call your dermatologist and set up a follow-up appointment.  You may continue with Eucerin cream, Aquaphor as needed ensuring to use these creams after a shower.  Ensure that you are taking lukewarm/cold showers.  Make sure that you are not scratching at the areas.  You may continue with over-the-counter hydrocortisone cream as directed.  Return to the emergency department for experiencing increasing/worsening fever, drainage, redness, worsening symptoms. ?

## 2021-07-15 NOTE — ED Triage Notes (Signed)
Patient here POV from Home with Rash. ? ?Rash initially began to Distal Left Forearm last week. Rash is now to Bilateral Arms and Neck. No New Introductions to [employer].  ? ?Also endorses a Wart-Like Skin Formation to Right Lateral Middle Finger for approximately 1 year. ? ?No Fevers. No N/V/D. ? ?NAD Noted during Triage. A&Ox4. GCS 15. Ambulatory. ?

## 2021-07-15 NOTE — ED Provider Notes (Signed)
?[facility] EM[facility]PT ?Provider Note ? ? ?CSN: [id number] ?Arrival date & time: 07/15/21  1817 ? ?  ? ?History ? ?Chief Complaint  ?Patient presents with  ? Rash  ? ? ?Katie Mills is a 41 y.o. female ? ?Who presents to the emergency department complaining of rash noted to left forearm last week.  Patient notes she now has a rash to her bilateral arms as well as to her bilateral anterior feet.  Patient works at a daycare.  Denies any new soap, lotion, detergent, medication, food, pets, environment, shampoo, conditioner.  Denies fever, chills, nausea, vomiting. ? ?The history is provided by the patient. No language interpreter was used.  ? ?  ? ?Home Medications ?Prior to Admission medications   ?Medication Sig Start Date End Date Taking? Authorizing Provider  ?triamcinolone cream (KENALOG) 0.1 % Apply 1 application. topically 2 (two) times daily. 07/15/21  Yes Suella Cogar A, PA-C  ?acetaminophen (TYLENOL) 500 MG tablet Take 1,000 mg by mouth every 6 (six) hours as needed for pain.    [provider]  ?doxycycline (VIBRAMYCIN) 100 MG capsule Take 1 capsule (100 mg total) by mouth 2 (two) times daily. 04/04/21   Dione Booze, MD  ?ibuprofen (ADVIL,MOTRIN) 600 MG tablet Take 1 tablet (600 mg total) by mouth 4 (four) times daily. 02/12/18   Ivery Quale, PA-C  ?predniSONE (DELTASONE) 50 MG tablet Take 1 tablet (50 mg total) by mouth daily. 04/04/21   Dione Booze, MD  ?   ? ?Allergies    ?Latex, Nickel, and Tomato   ? ?Review of Systems   ?Review of Systems  ?Constitutional:  Negative for chills and fever.  ?Gastrointestinal:  Negative for nausea and vomiting.  ?Skin:  Positive for rash. Negative for color change and wound.  ?All other systems reviewed and are negative. ? ?Physical Exam ?Updated Vital Signs ?BP (!) 152/98 (BP Location: Right Arm)   Pulse 74   Temp 98.5 ?F (36.9 ?C) (Oral)   Resp 16   Ht 5' 7.5" (1.715 m)   Wt 126 kg   SpO2 100%   BMI 42.86 kg/m?  ?Physical Exam ?Vitals  and nursing note reviewed.  ?Constitutional:   ?   General: She is not in acute distress. ?   Appearance: Normal appearance. She is not ill-appearing or diaphoretic.  ?HENT:  ?   Head: Normocephalic and atraumatic.  ?   Right Ear: External ear normal.  ?   Left Ear: External ear normal.  ?   Nose: Nose normal.  ?   Mouth/Throat:  ?   Mouth: Mucous membranes are moist.  ?   Pharynx: Oropharynx is clear. Uvula midline. No pharyngeal swelling, oropharyngeal exudate, posterior oropharyngeal erythema or uvula swelling.  ?   Tonsils: No tonsillar exudate or tonsillar abscesses.  ?   Comments: Patent airway. Uvula midline without swelling. ?Eyes:  ?   General: No scleral icterus. ?   Extraocular Movements: Extraocular movements intact.  ?Cardiovascular:  ?   Rate and Rhythm: Normal rate.  ?Pulmonary:  ?   Effort: Pulmonary effort is normal. No respiratory distress.  ?Musculoskeletal:     ?   General: Normal range of motion.  ?   Cervical back: Neck supple.  ?Skin: ?   General: Skin is warm and dry.  ?   Findings: Rash present. No abrasion, abscess or erythema. Rash is scaling.  ?   Comments: Scaling, pruritic, lichenification noted to right anterior foot.  No surrounding erythema. Scaling area noted  to left distal forearm, left and right digits interwebbing. No appreciable vesicular lesions noted to hands or feet. No appreciable mucosal lesions on exam.  Patient with a wartlike formation noted to the right lateral aspect of the third digit.  No tenderness to palpation noted to the region.  No drainage noted from the area.  ?Neurological:  ?   Mental Status: She is alert.  ? ? ?ED Results / Procedures / Treatments   ?Labs ?(all labs ordered are listed, but only abnormal results are displayed) ?Labs Reviewed - No data to display ? ?EKG ?None ? ?Radiology ?No results found. ? ?Procedures ?Procedures  ? ? ?Medications Ordered in ED ?Medications - No data to display ? ?ED Course/ Medical Decision Making/ A&P ?Clinical Course as  of 07/17/21 0041  ?Mon Jul 15, 2021  ?2217 Discussed with patient that she may follow-up with her dermatologist regarding the probable wart noted to her right lateral middle finger. [SB]  ?  ?Clinical Course User Index ?[SB] Rubie Ficco A, PA-C  ? ?                        ?Medical Decision Making ?Risk ?Prescription drug management. ? ? ?Pt presents with rash noted to left forearm onset last week.  Patient has a prior history of eczema.  She has tried Eucerin, Aquaphor, over-the-counter hydrocortisone cream with no relief of her symptoms.  Denies sick contacts.  Denies any new pets, medication, food, soap, lotion, detergent.  Vital signs stable, patient afebrile.  On exam patient with scaling pruritic lichenification noted to right anterior foot, left distal forearm, left and right digits into webbing.  No surrounding erythema.  No appreciable vesicular lesions noted to hands, feet, mucosa.  Differential diagnosis includes atopic dermatitis, cellulitis, abscess, hand-foot-and-mouth.  ? ?Disposition: ?Patient presentation suspicious for atopic dermatitis.  Doubt cellulitis or abscess at this time.  Doubt hand-foot-and-mouth, no appreciable vesicular lesions noted to soles of feet, palms of hands, or mucosa. After consideration of the diagnostic results and the patients response to treatment, I feel that the patient would benefit from Discharge home.  We will send prescription for Kenalog cream and discussed with patient importance of continuation with emollients.  Also discussed with patient importance of following up with her dermatologist for her symptoms.  Discussed with patient that she can follow-up with her primary care provider or dermatologist as needed for management of her wartlike area to her right third finger.  Supportive care measures and strict return precautions discussed with patient at bedside. Pt acknowledges and verbalizes understanding. Pt appears safe for discharge. Follow up as indicated in  discharge paperwork.  ? ? ?This chart was dictated using voice recognition software, Dragon. Despite the best efforts of this provider to proofread and correct errors, errors may still occur which can change documentation meaning. ? ?Final Clinical Impression(s) / ED Diagnoses ?Final diagnoses:  ?Eczema, unspecified type  ? ? ?Rx / DC Orders ?ED Discharge Orders   ? ?      Ordered  ?  triamcinolone cream (KENALOG) 0.1 %  2 times daily       ? 07/15/21 2220  ? ?  ?  ? ?  ? ? ?  ?Vince Ainsley A, PA-C ?07/17/21 0041 ? ?  ?Ernie Avena, MD ?07/17/21 1912 ? ?

## 2021-07-15 NOTE — ED Notes (Signed)
EMT-P provided AVS using Teachback Method. Patient verbalizes understanding of Discharge Instructions. Opportunity for Questioning and Answers were provided by EMT-P. Patient Discharged from ED.  ? ?

## 2022-05-26 IMAGING — DX DG CHEST 1V PORT
1 series · 1 of 1 positions shown · non-contrast
Comparison: Radiograph 01/25/2014

CLINICAL DATA: Chest pain, shortness of breath

EXAM:
PORTABLE CHEST 1 VIEW

[chest ap grid]
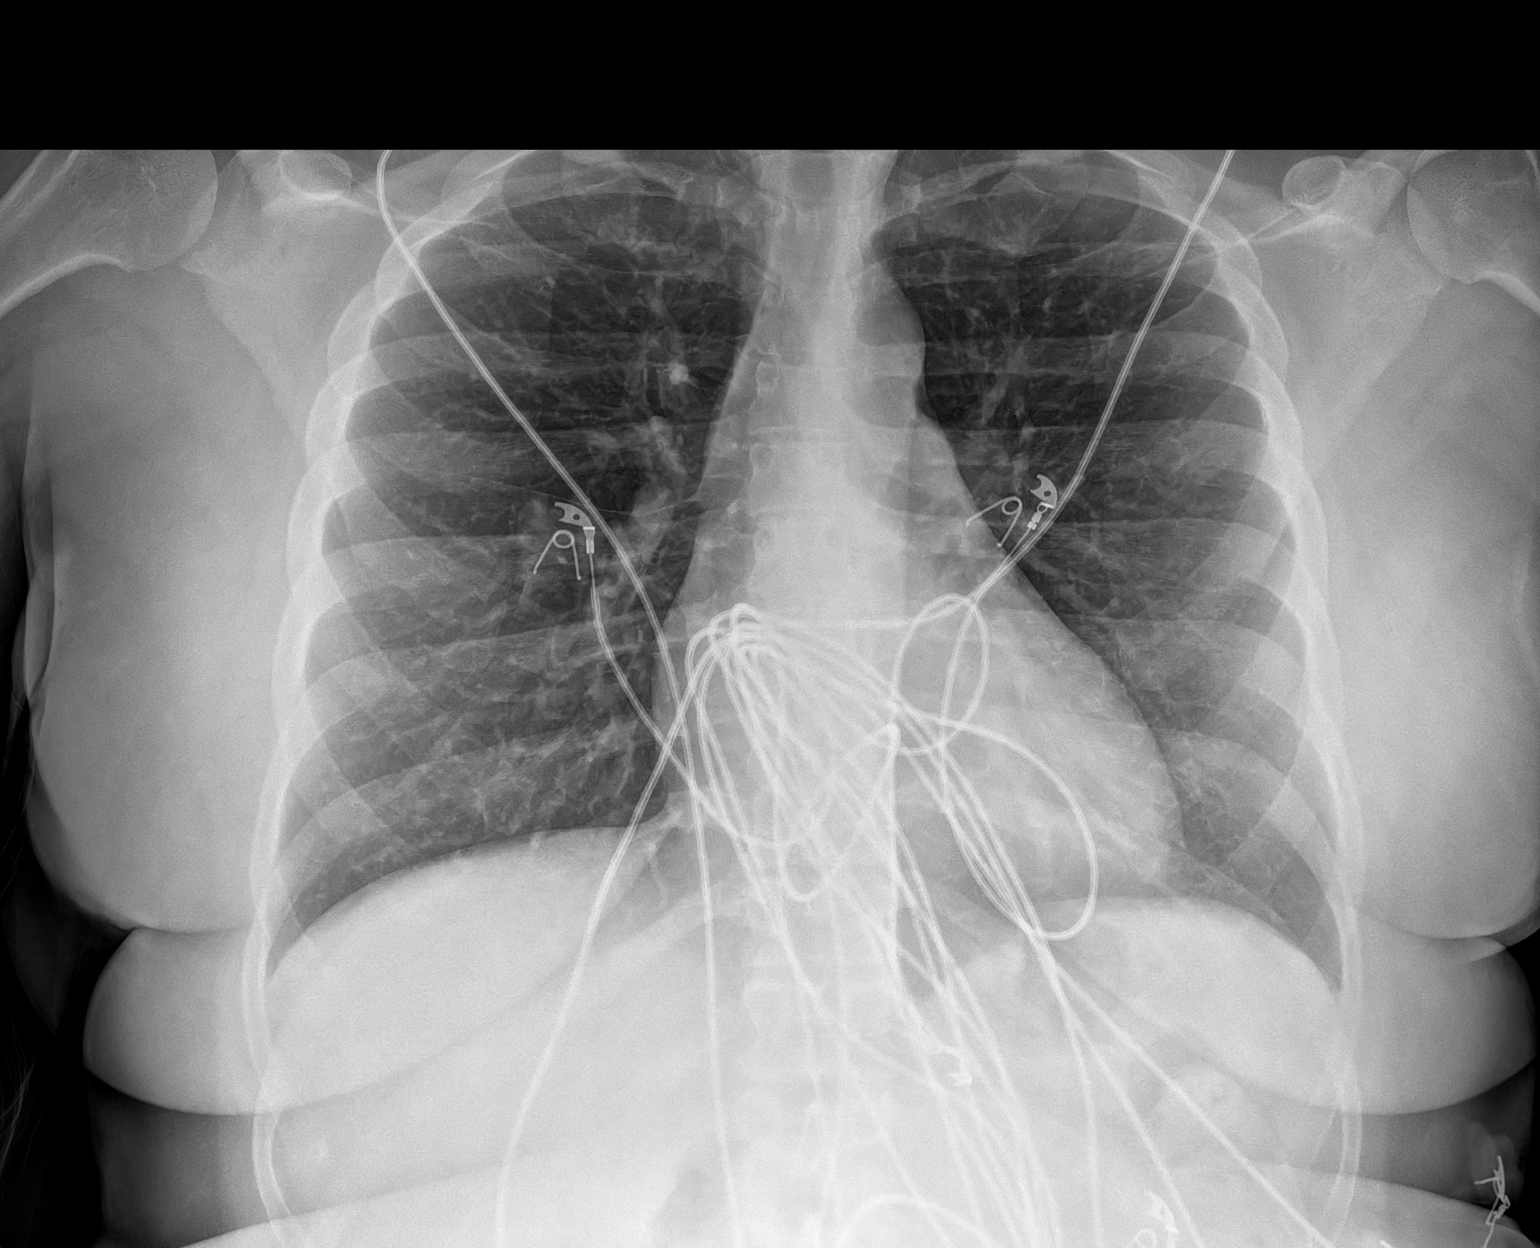

[1 of 1 positions shown; findings below may reference images not displayed]

FINDINGS: No consolidation, features of edema, pneumothorax, or effusion.
Pulmonary vascularity is normally distributed. The cardiomediastinal
contours are unremarkable. No acute osseous or soft tissue
abnormality. Telemetry leads overlie the chest.
IMPRESSION: No acute cardiopulmonary abnormality.

## 2023-01-14 ENCOUNTER — Emergency Department
Admission: EM | Admit: 2023-01-14 | Discharge: 2023-01-14 | Disposition: A | Discharge status: Home / Self Care | Payer: Medicaid Other | Attending: Emergency Medicine | Admitting: Emergency Medicine

## 2023-01-14 ENCOUNTER — Other Ambulatory Visit: Payer: Self-pay

## 2023-01-14 ENCOUNTER — Emergency Department: Payer: Medicaid Other

## 2023-01-14 DIAGNOSIS — E669 Obesity, unspecified: Secondary | ICD-10-CM | POA: Diagnosis not present

## 2023-01-14 DIAGNOSIS — M7989 Other specified soft tissue disorders: Secondary | ICD-10-CM | POA: Diagnosis present

## 2023-01-14 LAB — BASIC METABOLIC PANEL
Anion gap: 10 (ref 5–15)
BUN: 17 mg/dL (ref 6–20)
CO2: 23 mmol/L (ref 22–32)
Calcium: 8.5 mg/dL — ABNORMAL LOW (ref 8.9–10.3)
Chloride: 103 mmol/L (ref 98–111)
Creatinine, Ser: 0.83 mg/dL (ref 0.44–1.00)
GFR, Estimated: >60 mL/min (ref >60)
Glucose, Bld: 102 mg/dL — ABNORMAL HIGH (ref 70–99)
Potassium: 3.7 mmol/L (ref 3.5–5.1)
Sodium: 136 mmol/L (ref 135–145)

## 2023-01-14 LAB — BRAIN NATRIURETIC PEPTIDE: B Natriuretic Peptide: 16 pg/mL (ref 0.0–100.0)

## 2023-01-14 LAB — CBC
HCT: 33 % — ABNORMAL LOW (ref 36.0–46.0)
Hemoglobin: 10.5 g/dL — ABNORMAL LOW (ref 12.0–15.0)
MCH: 28.5 pg (ref 26.0–34.0)
MCHC: 31.8 g/dL (ref 30.0–36.0)
MCV: 89.4 fL (ref 80.0–100.0)
Platelets: 319 10*3/uL (ref 150–400)
RBC: 3.69 MIL/uL — ABNORMAL LOW (ref 3.87–5.11)
RDW: 12.3 % (ref 11.5–15.5)
WBC: 7.8 10*3/uL (ref 4.0–10.5)
nRBC: 0 % (ref 0.0–0.2)

## 2023-01-14 NOTE — ED Provider Notes (Signed)
[facility] Emergency Department Provider Note     Event Date/Time   First MD Initiated Contact with Patient 01/14/23 1920     (approximate)   History   edema   HPI  Katie Mills is a 42 y.o. female with a history of prediabetes eczema, anemia, as well as obesity, presents to the ED for evaluation of bilateral upper arm swelling.  Patient with Katie Mills is been several months that she is experienced arm heaviness and swelling to the upper arms in the tricep region and above the elbows.  She denies any distal paresthesias, edema, hand or finger swelling, or distal paresthesias.  She also denies any significant shortness of breath but did note some intermittent chest pain over the last several months.  Patient would describe a 1 pound weight gain over the last year or more.  She has been under the care of her primary provider, and the patient has declined medical management of her obesity and lieu of dietary changes and exercise.  She would however admit that she has been poorly compliant with both.  Patient denies any recent injury, trauma, fall.  Patient denies any history of hypertension, heart failure, DVT/PE, cough, hemoptysis, or syncope.  She denies any distal paresthesias, distal edema, or weakness.  No bladder or bowel changes, foot up, noticed injury reported.  He was advised by her primary provider to report to the ED to be evaluated for possible fluid overload.    Physical Exam   Triage Vital Signs: ED Triage Vitals  Encounter Vitals Group     BP 01/14/23 1748 136/78     Systolic BP Percentile --      Diastolic BP Percentile --      Pulse Rate 01/14/23 1748 (!) 107     Resp 01/14/23 1748 18     Temp 01/14/23 1748 98.9 F (37.2 C)     Temp src --      SpO2 01/14/23 1748 99 %     Weight 01/14/23 1749 270 lb (122.5 kg)     Height 01/14/23 1749 5' 7.5" (1.715 m)     Head Circumference --      Peak Flow --      Pain Score 01/14/23 1749 0      Pain Loc --      Pain Education --      Exclude from Growth Chart --     Most recent vital signs: Vitals:   01/14/23 1748  BP: 136/78  Pulse: (!) 107  Resp: 18  Temp: 98.9 F (37.2 C)  SpO2: 99%    General Awake, no distress. NAD HEENT NCAT. PERRL. EOMI. No rhinorrhea. Mucous membranes are moist.  CV:  Good peripheral perfusion. RRR.  No CCE distally. RESP:  Normal effort. CTA ABD:  No distention.  MSK:  Bilateral upper extremities with normal composite fist noted.  No significant edema distally.  Patient with soft tissue fullness to the bilateral upper extremities primarily to the posterior arms soft tissue fullness noted above the elbows.   SKIN:  Warm dry and intact.  No induration, cellulitis, abscess, purulence noted.  No palpable cystic lesions noted.   ED Results / Procedures / Treatments   Labs (all labs ordered are listed, but only abnormal results are displayed) Labs Reviewed  CBC - Abnormal; Notable for the following components:      Result Value   RBC 3.69 (*)    Hemoglobin 10.5 (*)    HCT 33.0 (*)  All other components within normal limits  BASIC METABOLIC PANEL - Abnormal; Notable for the following components:   Glucose, Bld 102 (*)    Calcium 8.5 (*)    All other components within normal limits  BRAIN NATRIURETIC PEPTIDE    EKG  Vent. rate 72 BPM PR interval 166 ms QRS duration 84 ms QT/QTcB 378/413 ms P-R-T axes 37 47 39 Normal sinus rhythm Normal ECG When compared with ECG of 20-JUL-  RADIOLOGY  I personally viewed and evaluated these images as part of my medical decision making, as well as reviewing the written report by the radiologist.  ED Provider Interpretation: No acute chest findings  DG Chest 2 View  Result Date: 01/14/2023 CLINICAL DATA:  Increasing arm swelling. EXAM: CHEST - 2 VIEW COMPARISON:  One-view chest x-ray 11/29/2019 FINDINGS: The heart size and mediastinal contours are within normal limits. Both lungs are clear. The  visualized skeletal structures are unremarkable. IMPRESSION: Negative two view chest x-ray. Electronically Signed   By: Marin Roberts M.D.   On: 01/14/2023 19:15     PROCEDURES:  Critical Care performed: No  Procedures   MEDICATIONS ORDERED IN ED: Medications - No data to display   IMPRESSION / MDM / ASSESSMENT AND PLAN / ED COURSE  I reviewed the triage vital signs and the nursing notes.                              Differential diagnosis includes, but is not limited to, cystic formation, tendinitis, bursitis, peripheral edema, peripheral vascular disease  Patient's presentation is most consistent with acute complicated illness / injury requiring diagnostic workup.  ----------------------------------------- 11:23 PM on 01/14/2023 ----------------------------------------- Patient after discharge from the ED despite pending ultrasound results.  She will follow results on: MyChart and follow with primary provider as discussed.  Patient's diagnosis is consistent with bilateral upper arm fullness likely secondary to unintended weight gain.  No evidence of pitting edema or peripheral vascular disease.  Patient's labs, chest x-ray, and EKG are all normal and reassuring at this time.  Ultrasound results are pending at time of this disposition by my physician for any focal abscess formation, cystic formation, or any other malignancies low at this time given patient bilateral and symmetric presentation. Patient is to follow up with her primary provider as discussed, as needed or otherwise directed. Patient is given ED precautions to return to the ED for any worsening or new symptoms.  FINAL CLINICAL IMPRESSION(S) / ED DIAGNOSES   Final diagnoses:  Arm swelling  Obesity without serious comorbidity, unspecified classification, unspecified obesity type     Rx / DC Orders   ED Discharge Orders     None        Note:  This document was prepared using Dragon voice recognition  software and may include unintentional dictation errors.    Lissa Hoard, PA-C 01/14/23 2325    Chesley Noon, MD 01/15/23 [phone]

## 2023-01-14 NOTE — Discharge Instructions (Addendum)
Your exam and labs are normal and reassuring at this time.  No evidence of poor vascular flow without extremities, congestive heart failure, enlarged heart, or fluid overload.  Your pending ultrasound results will be available, but I have a low suspicion for any concerning findings.  Your symptoms may be due to to your unplanned weight gain.  You should follow-up with your primary provider for ongoing evaluation management.  Return to the ED if needed.

## 2023-01-14 NOTE — ED Triage Notes (Signed)
Pt to ED for bilateral arm swelling for months, states getting worse and was sent by PCP to make sure does not have fluid on lungs.

## 2023-04-18 ENCOUNTER — Ambulatory Visit
Admission: EM | Admit: 2023-04-18 | Discharge: 2023-04-18 | Disposition: A | Discharge status: Home / Self Care | Payer: Medicaid Other | Attending: Nurse Practitioner | Admitting: Nurse Practitioner

## 2023-04-18 DIAGNOSIS — R112 Nausea with vomiting, unspecified: Secondary | ICD-10-CM | POA: Diagnosis present

## 2023-04-18 DIAGNOSIS — T7840XA Allergy, unspecified, initial encounter: Secondary | ICD-10-CM | POA: Diagnosis present

## 2023-04-18 LAB — POCT RAPID STREP A (OFFICE): Rapid Strep A Screen: NEGATIVE

## 2023-04-18 MED ORDER — ONDANSETRON HCL 4 MG PO TABS: 4.0000 mg | ORAL_TABLET | Freq: Three times a day (TID) | ORAL | 0 refills | Status: AC | PRN

## 2023-04-18 NOTE — ED Triage Notes (Signed)
Patient reports last night after eating powdered donuts (unopened bag) she began to have episodes of wheezing, chest tightness/ burning, vomiting and felt like she had a golf ball in her throat. The patient states she noticed there was an odd smell when she smelled the inside of the bag. The patient denies [facility], denies rash and does not display physical s/s of respiratory distress at this time. Visibile airway appears patent.  Patient states she recently finished bactrim for UTI yesterday.

## 2023-04-18 NOTE — ED Provider Notes (Signed)
[facility]    CSN: [id number] Arrival date & time: 04/18/23  1109      History   Chief Complaint Chief Complaint  Patient presents with   Allergic Reaction    HPI Katie Mills is a 42 y.o. female.   The history is provided by the patient.   Patient presents for complaints of throat irritation, and chest irritation after vomiting after she ate some powdered donuts last evening.  Patient states she began vomiting within less than a minute after eating the doughnut.  She states when she ate the doughnut, she noticed it had a odd smell, she also noticed the odd smell in the bag.  Shortly afterwards, she began to have episodes of wheezing, chest tightness, chest burning, and vomiting.  Patient states that she vomited for quite some time last evening.  She states this morning, she developed headache, she states that she did take Tylenol and was able to tolerate Tylenol along with water.  She states that she continues to feel like there is a "golf ball" in her throat.  She denies fever, chills, difficulty breathing, wheezing, chest pain, abdominal pain, nausea, vomiting, diarrhea at this time.  Patient reports that she finished Bactrim 1 day ago prior to her symptoms starting.  Past Medical History:  Diagnosis Date   Anemia    Eczema    Headache     There are no problems to display for this patient.   Past Surgical History:  Procedure Laterality Date   DILATION AND CURETTAGE OF UTERUS      OB History     Gravida  9   Para  4   Term  4   Preterm      AB  5   Living  4      SAB  3   IAB  2   Ectopic      Multiple      Live Births               Home Medications    Prior to Admission medications   Medication Sig Start Date End Date Taking? Authorizing Provider  ondansetron (ZOFRAN) 4 MG tablet Take 1 tablet (4 mg total) by mouth every 8 (eight) hours as needed for nausea or vomiting. 04/18/23  Yes Leath-Warren, Sadie Haber, NP  acetaminophen  (TYLENOL) 500 MG tablet Take 1,000 mg by mouth every 6 (six) hours as needed for pain.    [provider]  doxycycline (VIBRAMYCIN) 100 MG capsule Take 1 capsule (100 mg total) by mouth 2 (two) times daily. 04/04/21   Dione Booze, MD  ibuprofen (ADVIL,MOTRIN) 600 MG tablet Take 1 tablet (600 mg total) by mouth 4 (four) times daily. 02/12/18   Ivery Quale, PA-C  predniSONE (DELTASONE) 50 MG tablet Take 1 tablet (50 mg total) by mouth daily. 04/04/21   Dione Booze, MD  triamcinolone cream (KENALOG) 0.1 % Apply 1 application. topically 2 (two) times daily. 07/15/21   Blue, Soijett A, PA-C    Family History Family History  Problem Relation Age of Onset   Cancer Other     Social History Social History   Tobacco Use   Smoking status: Never   Smokeless tobacco: Never  Substance Use Topics   Alcohol use: No   Drug use: No     Allergies   Latex, Nickel, and Tomato   Review of Systems Review of Systems Per HPI  Physical Exam Triage Vital Signs ED Triage Vitals  Encounter  Vitals Group     BP 04/18/23 1131 133/82     Systolic BP Percentile --      Diastolic BP Percentile --      Pulse Rate 04/18/23 1131 82     Resp 04/18/23 1131 18     Temp 04/18/23 1131 98.4 F (36.9 C)     Temp Source 04/18/23 1131 Oral     SpO2 04/18/23 1131 95 %     Weight --      Height --      Head Circumference --      Peak Flow --      Pain Score 04/18/23 1129 8     Pain Loc --      Pain Education --      Exclude from Growth Chart --    No data found.  Updated Vital Signs BP 133/82 (BP Location: Right Arm)   Pulse 82   Temp 98.4 F (36.9 C) (Oral)   Resp 18   LMP 04/11/2023   SpO2 95%   Visual Acuity Right Eye Distance:   Left Eye Distance:   Bilateral Distance:    Right Eye Near:   Left Eye Near:    Bilateral Near:     Physical Exam Vitals and nursing note reviewed.  Constitutional:      Appearance: Normal appearance.  HENT:     Head: Normocephalic.     Right  Ear: Tympanic membrane, ear canal and external ear normal.     Left Ear: Tympanic membrane, ear canal and external ear normal.     Nose: Nose normal.     Mouth/Throat:     Lips: Pink.     Mouth: Mucous membranes are moist.     Pharynx: Pharyngeal swelling, posterior oropharyngeal erythema and postnasal drip present. No uvula swelling.     Tonsils: 2+ on the right. 2+ on the left.  Eyes:     Extraocular Movements: Extraocular movements intact.     Pupils: Pupils are equal, round, and reactive to light.  Cardiovascular:     Pulses: Normal pulses.     Heart sounds: Normal heart sounds.  Pulmonary:     Effort: Pulmonary effort is normal. No respiratory distress.     Breath sounds: Normal breath sounds. No stridor. No wheezing, rhonchi or rales.  Abdominal:     General: Bowel sounds are normal.     Palpations: Abdomen is soft.     Tenderness: There is no abdominal tenderness.  Musculoskeletal:     Cervical back: Normal range of motion.  Lymphadenopathy:     Cervical: No cervical adenopathy.  Skin:    General: Skin is warm and dry.  Neurological:     General: No focal deficit present.     Mental Status: She is alert and oriented to person, place, and time.  Psychiatric:        Mood and Affect: Mood normal.        Behavior: Behavior normal.      UC Treatments / Results  Labs (all labs ordered are listed, but only abnormal results are displayed) Labs Reviewed  POCT RAPID STREP A (OFFICE)    EKG   Radiology No results found.  Procedures Procedures (including critical care time)  Medications Ordered in UC Medications - No data to display  Initial Impression / Assessment and Plan / UC Course  I have reviewed the triage vital signs and the nursing notes.  Pertinent labs & imaging results that were available during  my care of the patient were reviewed by me and considered in my medical decision making (see chart for details).  Rapid strep test was negative, throat  culture is pending. Suspect patient may have had reaction to the dentist that she was eating causing her to have excessive nausea and vomiting.  Also feel the gastric acid associate with vomiting most likely has caused her chest discomfort.  Patient has been able to tolerate water when she took medication earlier today.  Ondansetron 4 mg prescribed for nausea and vomiting.  Supportive care recommendations were provided and discussed with the patient to include increasing fluids, rest, and a soft diet until symptoms resolved.  Patient was given follow-up precautions.  Patient was in agreement with this plan of care and verbalized understanding.  All questions were answered.  Patient stable for discharge.  Work note was provided. Final Clinical Impressions(s) / UC Diagnoses   Final diagnoses:  Allergic reaction, initial encounter  Nausea and vomiting, unspecified vomiting type     Discharge Instructions      The rapid strep test was negative.  A throat culture has been ordered.  You will be contacted with the pending test result is abnormal. Take medication as prescribed. May continue over-the-counter Tylenol as needed for pain or discomfort. Increase fluids and allow for plenty of rest.  Recommend a brat diet or diet such as soup, broth, yogurt, pudding, Jell-O, or popsicles until symptoms improve. If you develop worsening symptoms, such as shortness of breath, difficulty breathing, or other concerns, please follow-up in the emergency department for further evaluation. Follow-up as needed.     ED Prescriptions     Medication Sig Dispense Auth. Provider   ondansetron (ZOFRAN) 4 MG tablet Take 1 tablet (4 mg total) by mouth every 8 (eight) hours as needed for nausea or vomiting. 20 tablet Leath-Warren, Sadie Haber, NP      PDMP not reviewed this encounter.   Abran Cantor, NP 04/18/23 1357

## 2023-04-18 NOTE — Discharge Instructions (Addendum)
The rapid strep test was negative.  A throat culture has been ordered.  You will be contacted with the pending test result is abnormal. Take medication as prescribed. May continue over-the-counter Tylenol as needed for pain or discomfort. Increase fluids and allow for plenty of rest.  Recommend a brat diet or diet such as soup, broth, yogurt, pudding, Jell-O, or popsicles until symptoms improve. If you develop worsening symptoms, such as shortness of breath, difficulty breathing, or other concerns, please follow-up in the emergency department for further evaluation. Follow-up as needed.

## 2023-04-21 LAB — CULTURE, GROUP A STREP (THRC)

## 2023-06-02 IMAGING — DX DG ANKLE COMPLETE 3+V*L*
1 series · 4 of 4 positions shown · non-contrast
Comparison: No prior.

CLINICAL DATA: Fall.

EXAM:
LEFT ANKLE COMPLETE - 3+ VIEW

[Series 1: ankle · 0.14mm/px · 4 of 4 slices shown]
[im 1/4]
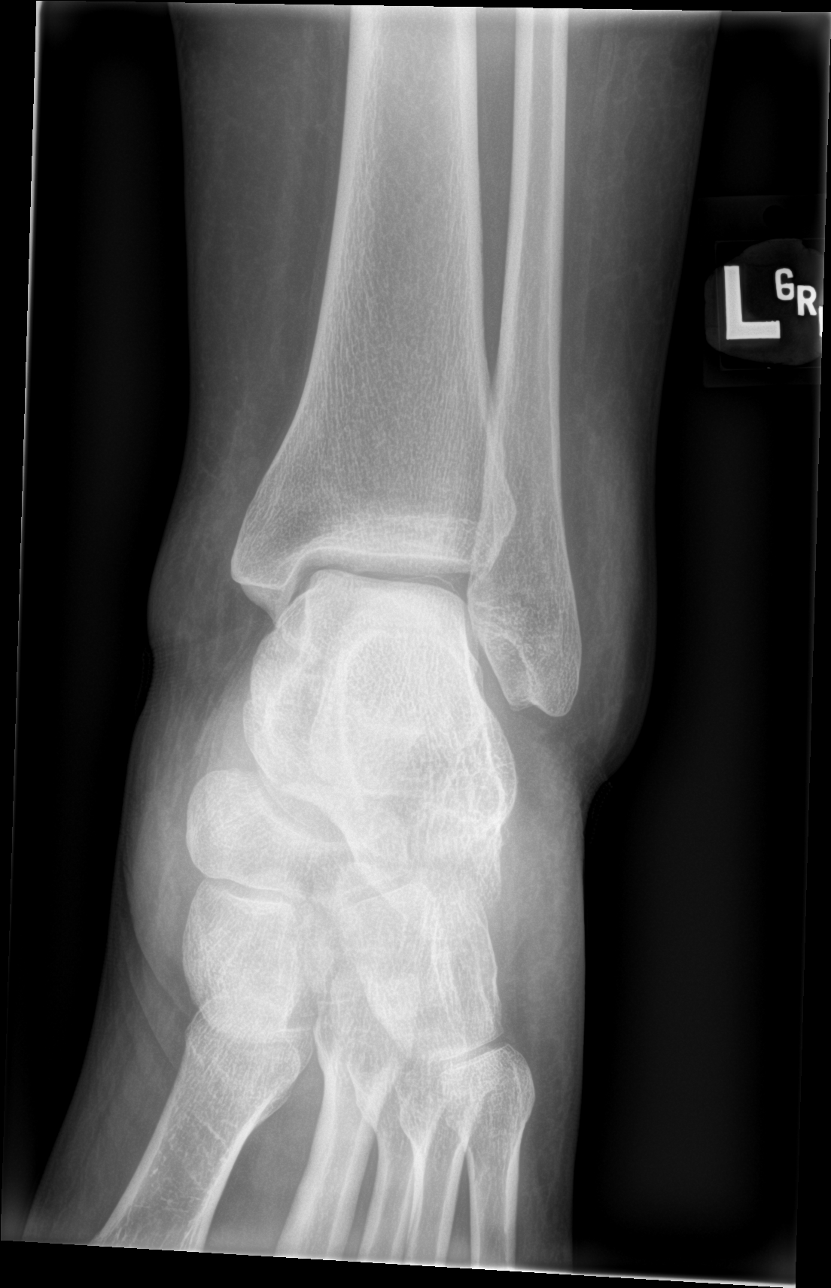
[im 2/4]
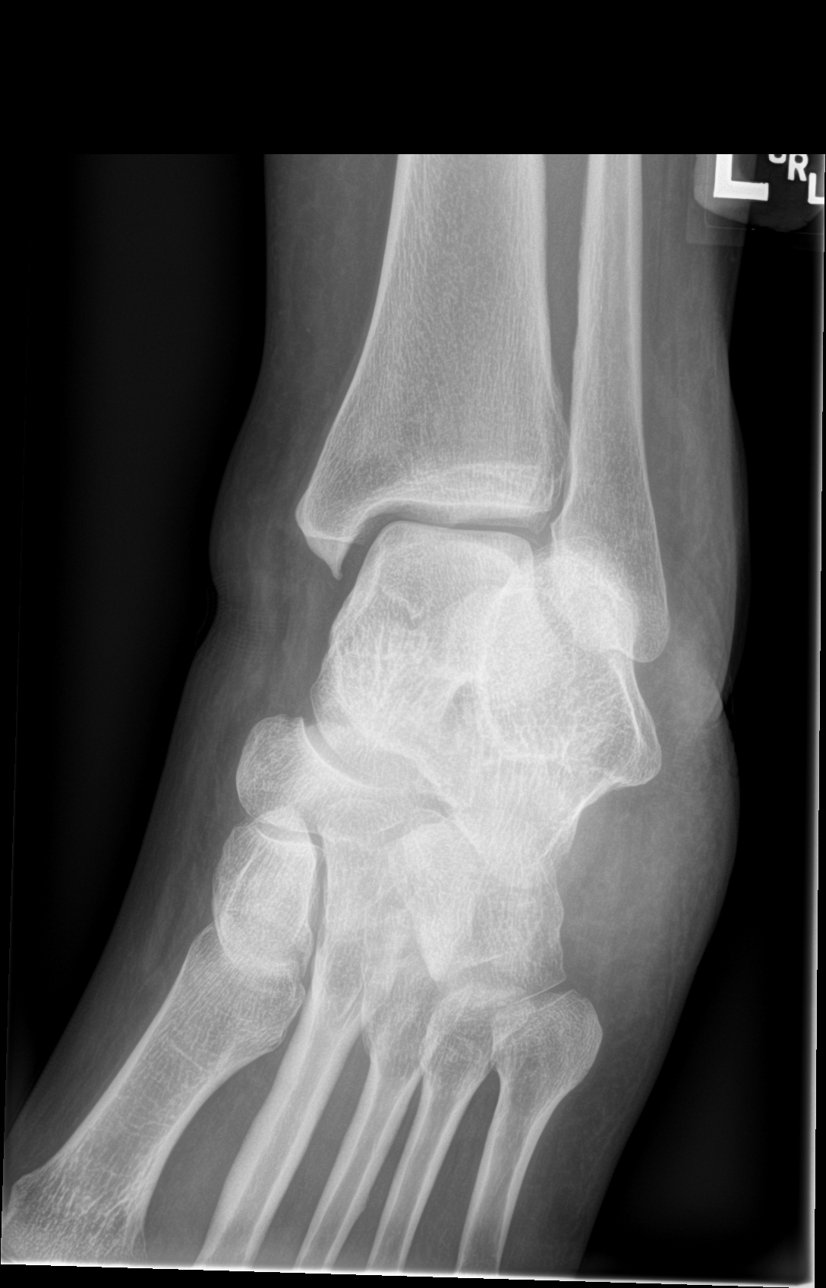
[im 3/4]
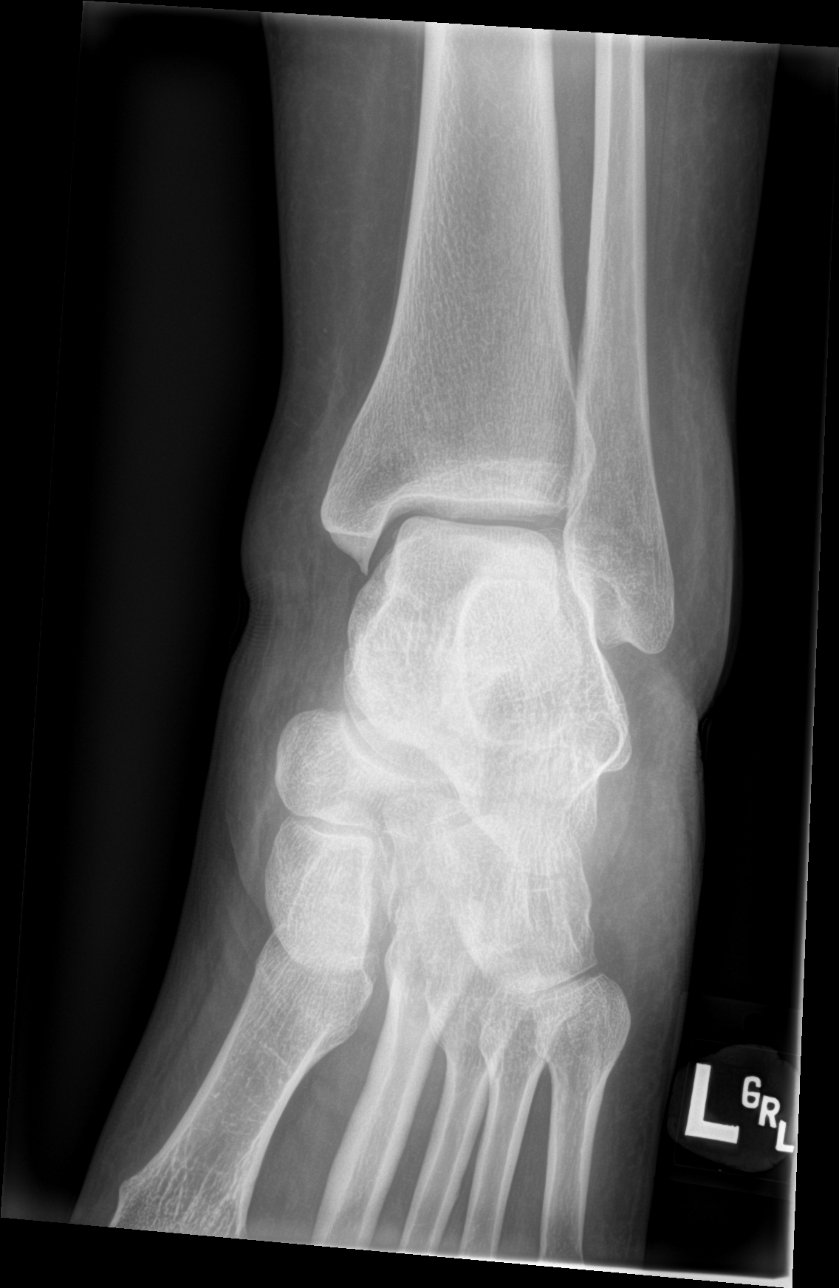
[im 4/4]
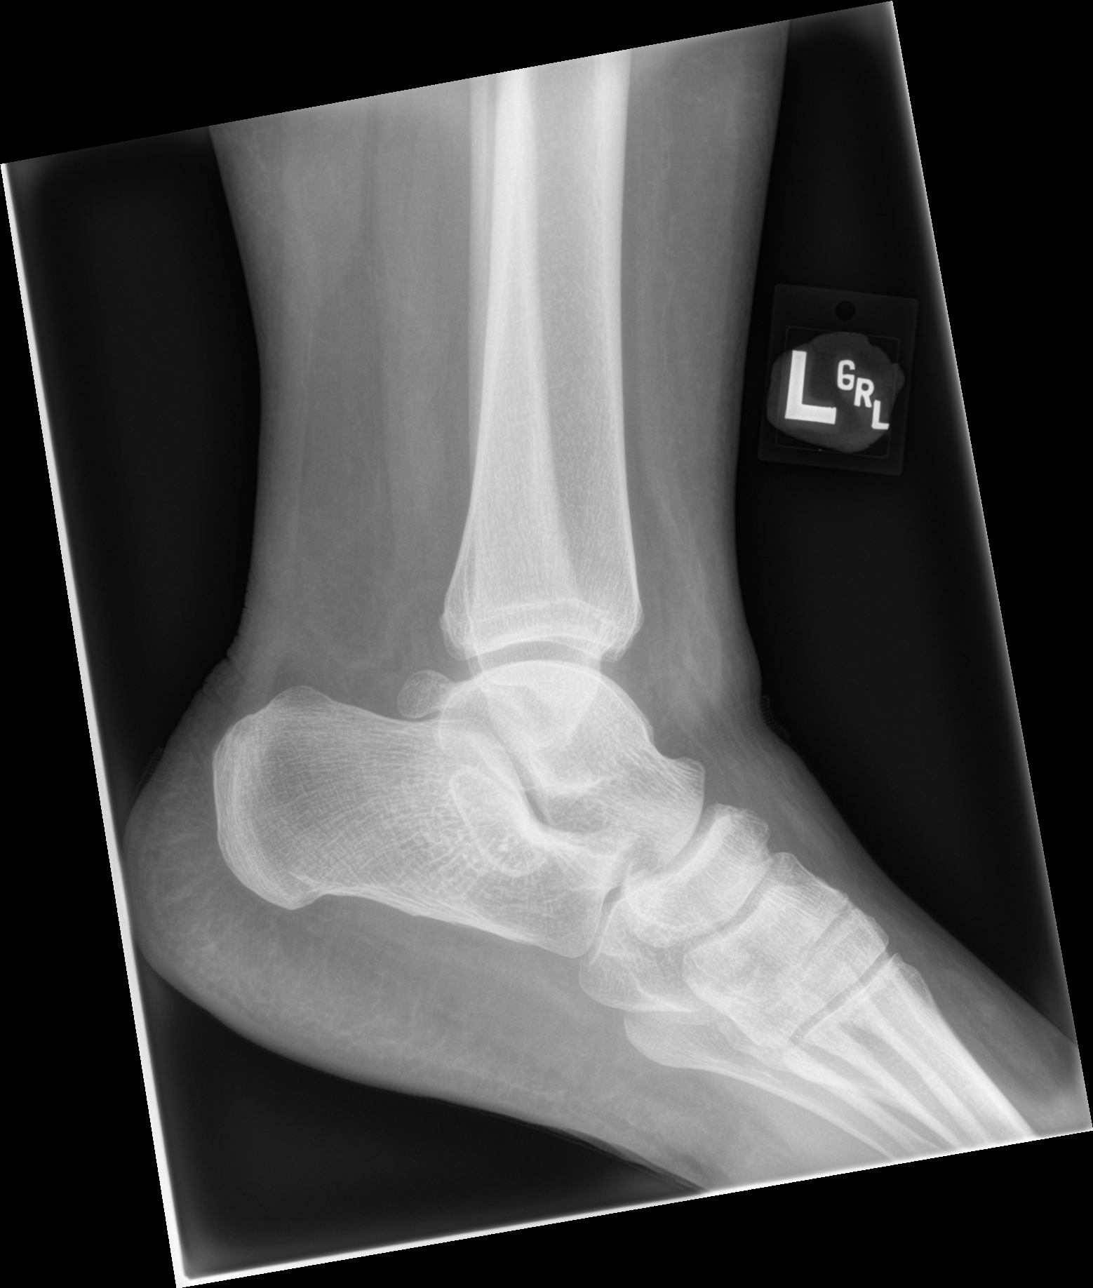

[4 of 4 positions shown; findings below may reference images not displayed]

FINDINGS: Diffuse soft tissue swelling. No acute bony or joint abnormality. No
evidence of fracture or dislocation.
IMPRESSION: Diffuse soft tissue swelling.  No acute bony abnormality.

## 2023-06-02 IMAGING — DX DG ANKLE COMPLETE 3+V*R*
1 series · 3 of 3 positions shown · non-contrast
Comparison: 10/16/2015.

CLINICAL DATA: Fall.

EXAM:
RIGHT ANKLE - COMPLETE 3+ VIEW

[Series 1: ankle · 0.14mm/px · 3 of 3 slices shown]
[im 1/3]
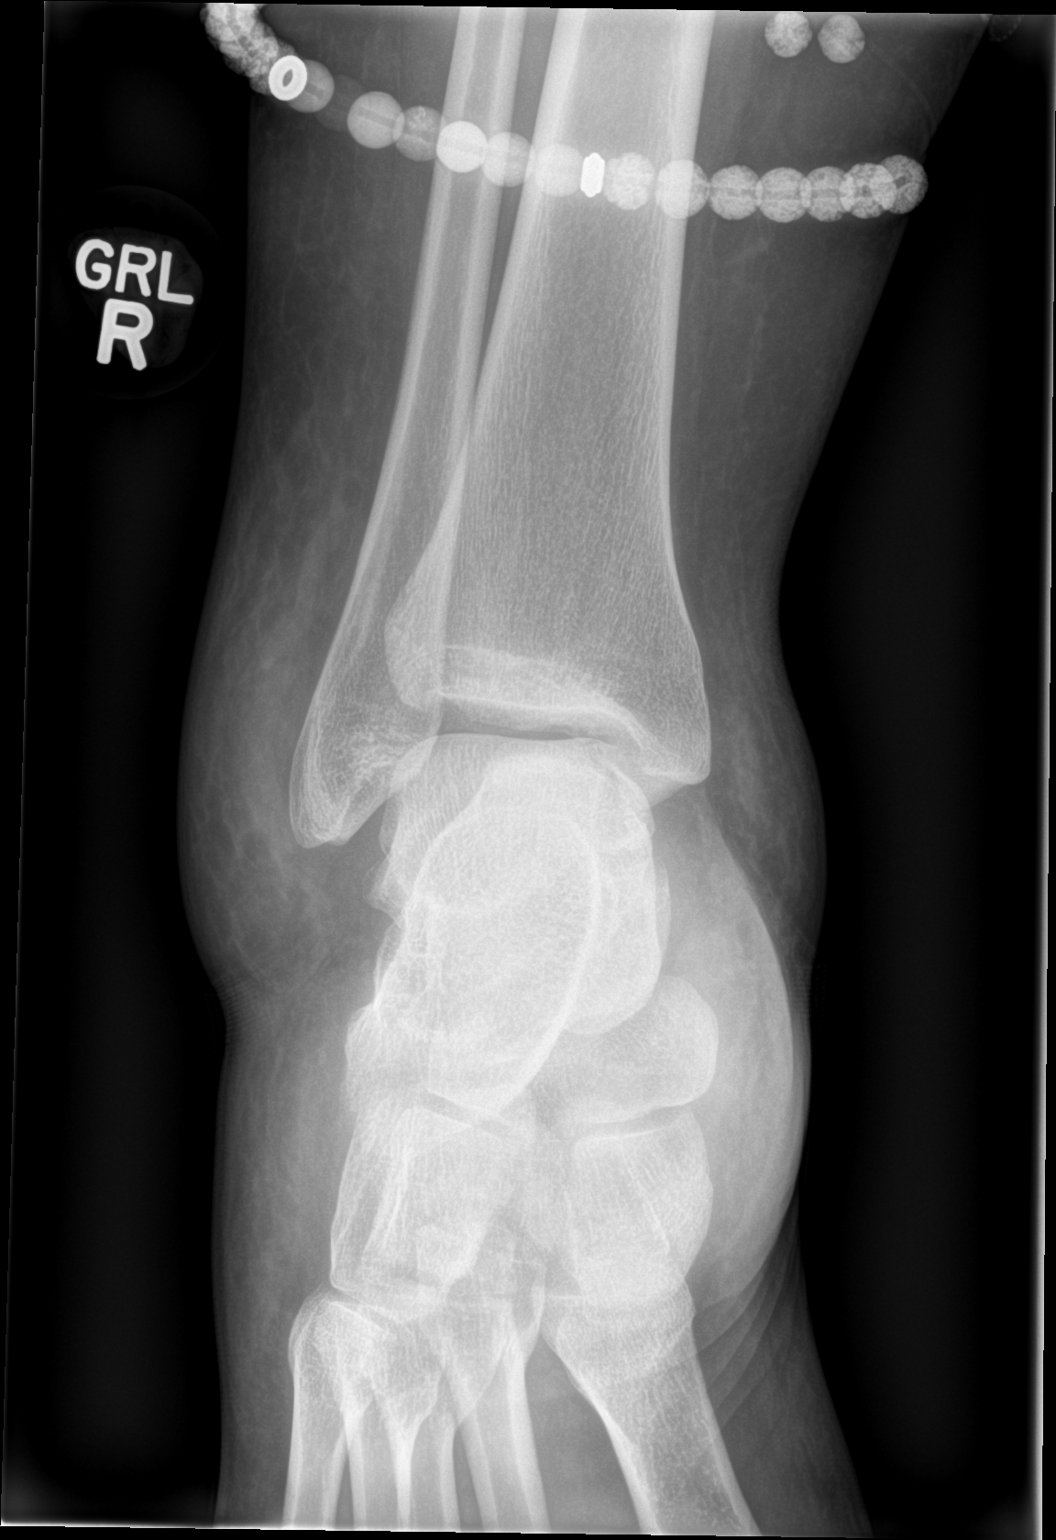
[im 2/3]
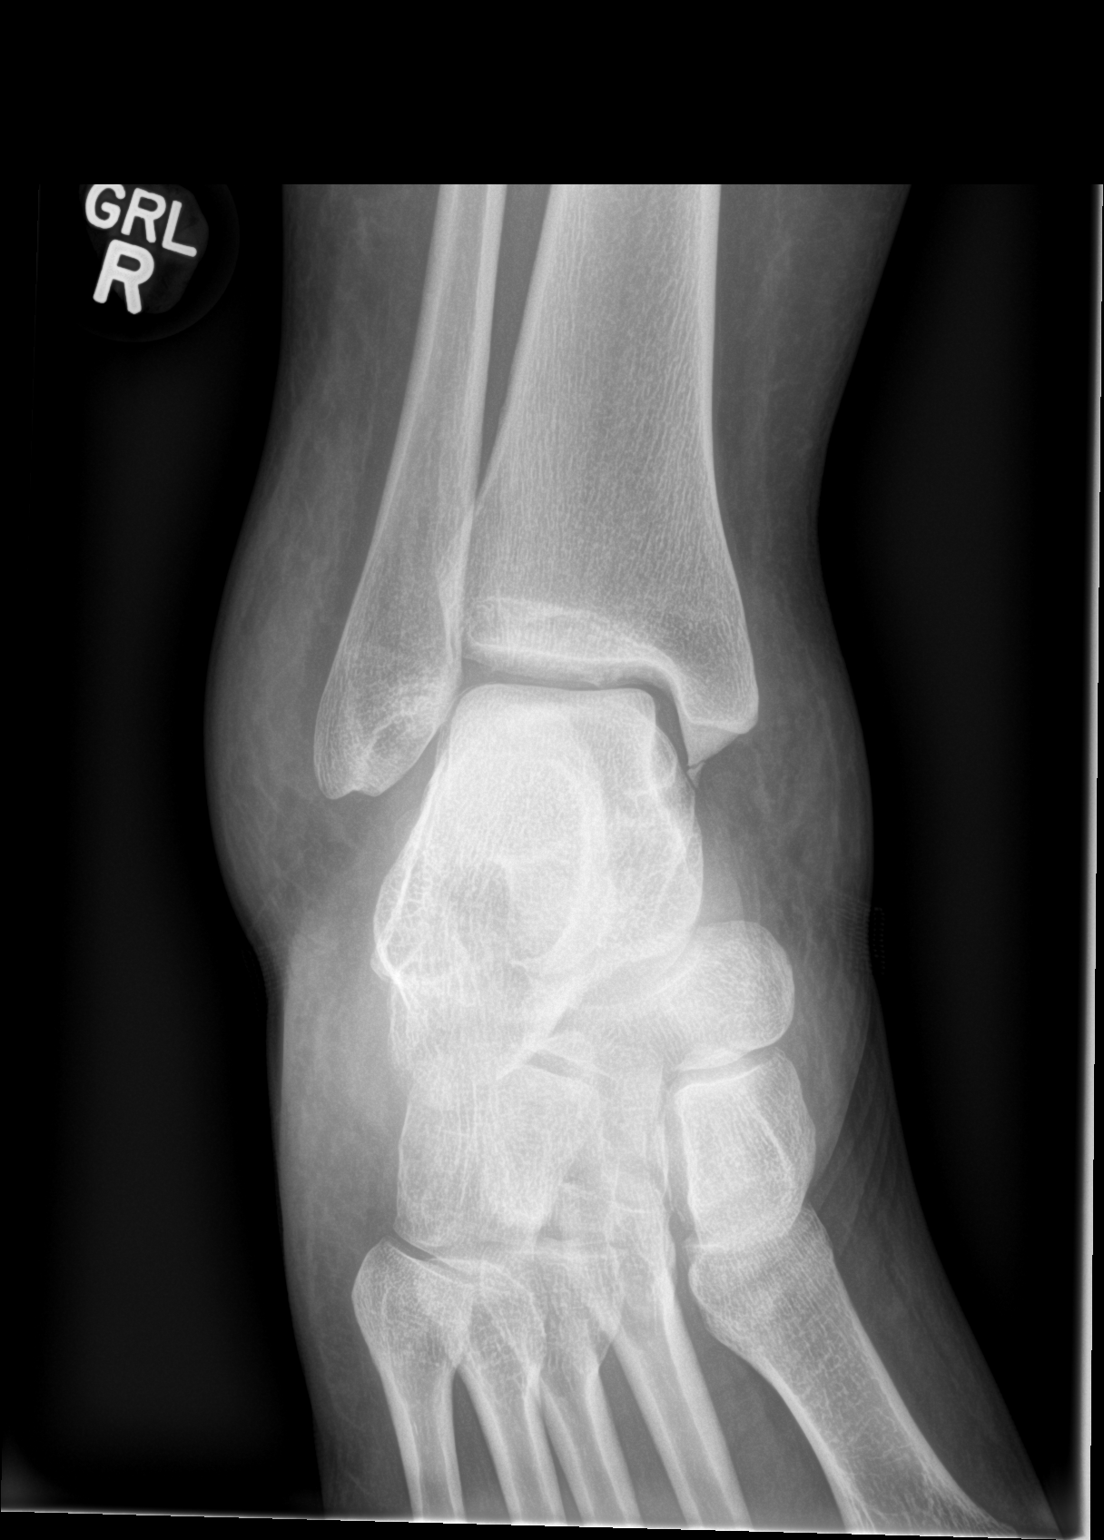
[im 3/3]
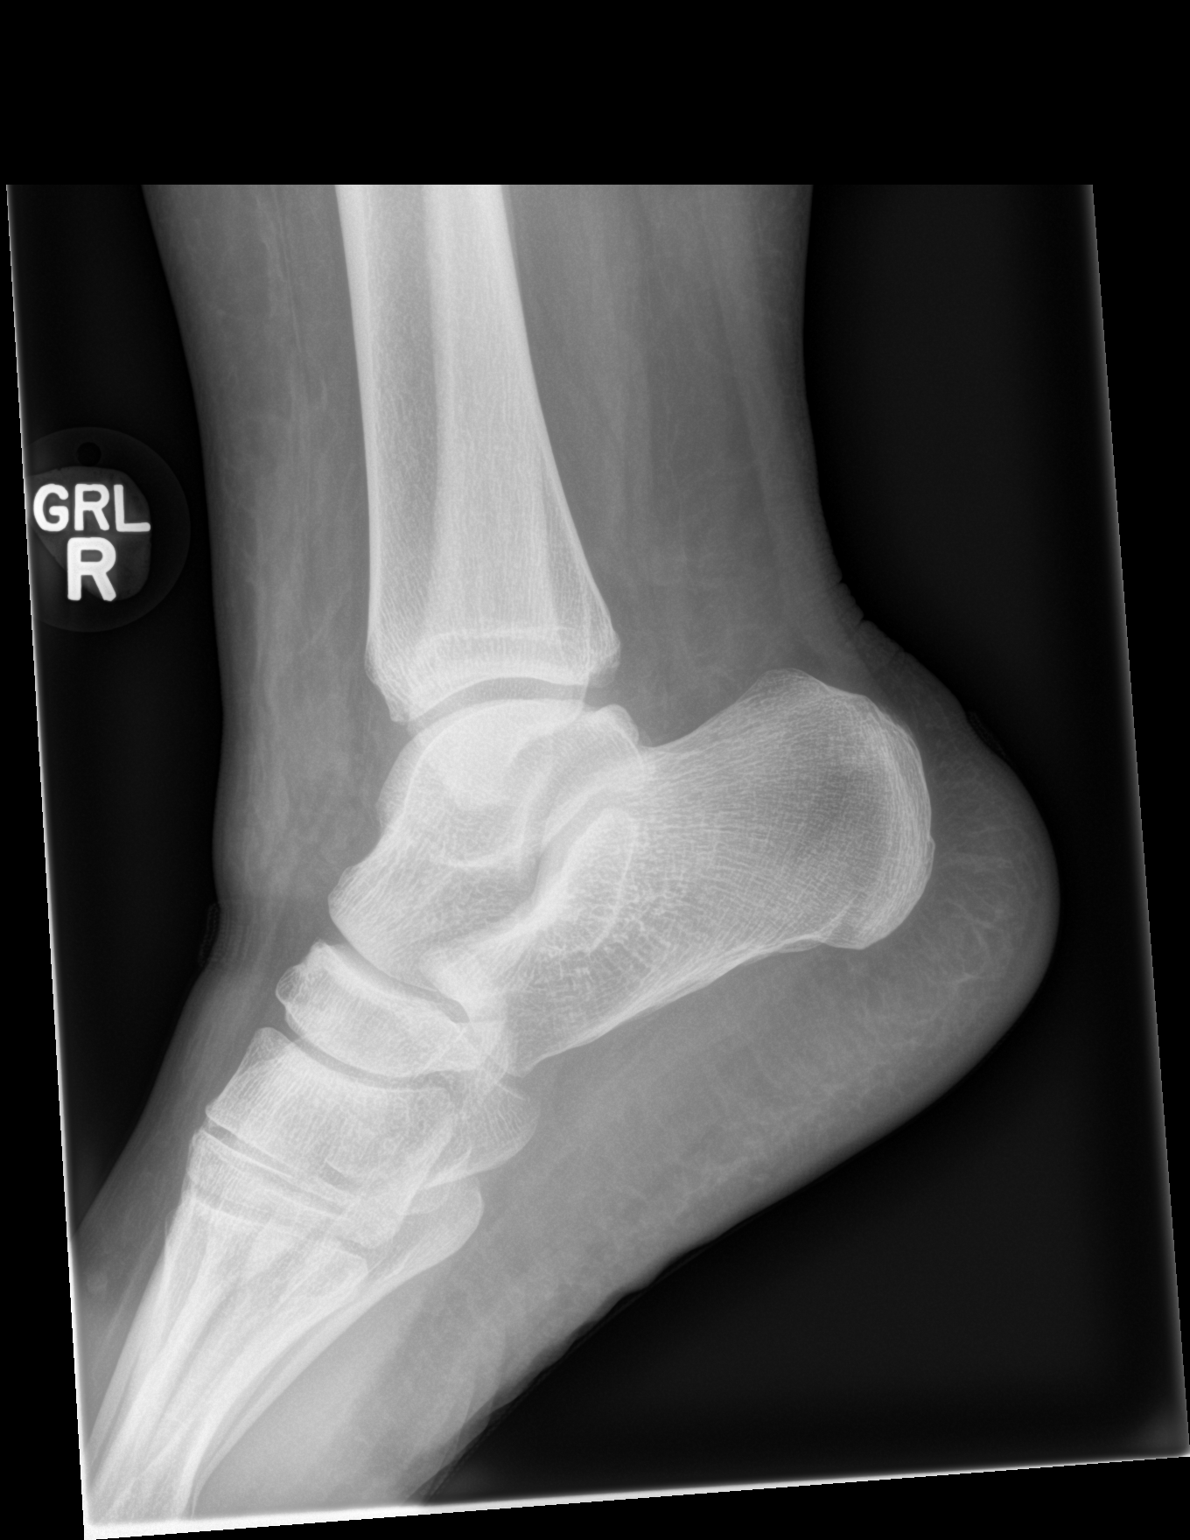

[3 of 3 positions shown; findings below may reference images not displayed]

FINDINGS: Diffuse soft tissue swelling. Tiny fracture fragment noted adjacent
to the distal tip of the medial malleolus. Age is undetermined.
Remainder of the right ankle is intact.
IMPRESSION: Diffuse soft tissue swelling. Tiny fracture fragment noted adjacent
to the distal tip of the medial malleolus. Age is undetermined.
